# Patient Record
Sex: Female | Born: 1950 | Race: Black or African American | Hispanic: No | Marital: Single | Smoking: Never smoker
Health system: Southern US, Community
[De-identification: ages and names within clinical notes are randomized; demographics above are authoritative.]

## PROBLEM LIST (undated history)

## (undated) DIAGNOSIS — I10 Essential (primary) hypertension: Secondary | ICD-10-CM

## (undated) DIAGNOSIS — K5792 Diverticulitis of intestine, part unspecified, without perforation or abscess without bleeding: Secondary | ICD-10-CM

## (undated) DIAGNOSIS — K219 Gastro-esophageal reflux disease without esophagitis: Secondary | ICD-10-CM

## (undated) DIAGNOSIS — E049 Nontoxic goiter, unspecified: Secondary | ICD-10-CM

## (undated) DIAGNOSIS — E785 Hyperlipidemia, unspecified: Secondary | ICD-10-CM

## (undated) DIAGNOSIS — N809 Endometriosis, unspecified: Secondary | ICD-10-CM

## (undated) HISTORY — PX: LOBECTOMY: SHX5089

## (undated) HISTORY — PX: HAND SURGERY: SHX662

---

## 2004-10-28 ENCOUNTER — Ambulatory Visit: Payer: Self-pay | Admitting: Unknown Physician Specialty

## 2005-08-04 ENCOUNTER — Emergency Department: Payer: Self-pay | Admitting: Emergency Medicine

## 2006-06-29 ENCOUNTER — Other Ambulatory Visit: Payer: Self-pay

## 2006-06-29 ENCOUNTER — Observation Stay: Payer: Self-pay | Admitting: Internal Medicine

## 2009-07-17 ENCOUNTER — Emergency Department: Payer: Self-pay | Admitting: Emergency Medicine

## 2011-09-15 ENCOUNTER — Emergency Department: Payer: Self-pay | Admitting: Emergency Medicine

## 2012-02-27 ENCOUNTER — Ambulatory Visit: Payer: Self-pay | Admitting: Family Medicine

## 2015-10-17 ENCOUNTER — Emergency Department
Admission: EM | Admit: 2015-10-17 | Discharge: 2015-10-17 | Disposition: A | Discharge status: Home / Self Care | Payer: BC Managed Care – PPO | Attending: Emergency Medicine | Admitting: Emergency Medicine

## 2015-10-17 DIAGNOSIS — T7840XA Allergy, unspecified, initial encounter: Secondary | ICD-10-CM | POA: Diagnosis present

## 2015-10-17 DIAGNOSIS — X58XXXA Exposure to other specified factors, initial encounter: Secondary | ICD-10-CM | POA: Insufficient documentation

## 2015-10-17 DIAGNOSIS — Y9289 Other specified places as the place of occurrence of the external cause: Secondary | ICD-10-CM | POA: Diagnosis not present

## 2015-10-17 DIAGNOSIS — L500 Allergic urticaria: Secondary | ICD-10-CM | POA: Insufficient documentation

## 2015-10-17 DIAGNOSIS — Y998 Other external cause status: Secondary | ICD-10-CM | POA: Insufficient documentation

## 2015-10-17 DIAGNOSIS — L509 Urticaria, unspecified: Secondary | ICD-10-CM

## 2015-10-17 DIAGNOSIS — Y9389 Activity, other specified: Secondary | ICD-10-CM | POA: Diagnosis not present

## 2015-10-17 MED ORDER — FAMOTIDINE IN NACL 20-0.9 MG/50ML-% IV SOLN
20.0000 mg | Freq: Once | INTRAVENOUS | Status: AC
  Administered 2015-10-17: 20 mg via INTRAVENOUS
  Filled 2015-10-17: qty 50

## 2015-10-17 MED ORDER — PREDNISONE 20 MG PO TABS
60.0000 mg | ORAL_TABLET | Freq: Every day | ORAL | Status: AC
Start: 2015-10-17 — End: 2016-10-20

## 2015-10-17 MED ORDER — LORATADINE 10 MG PO TABS
ORAL_TABLET | ORAL | Status: AC
  Filled 2015-10-17: qty 1

## 2015-10-17 MED ORDER — CETIRIZINE HCL 5 MG/5ML PO SYRP: 5.0000 mg | ORAL_SOLUTION | Freq: Once | ORAL | Status: DC

## 2015-10-17 MED ORDER — DIPHENHYDRAMINE HCL 50 MG/ML IJ SOLN
25.0000 mg | Freq: Once | INTRAMUSCULAR | Status: AC
  Administered 2015-10-17: 25 mg via INTRAVENOUS
  Filled 2015-10-17: qty 1

## 2015-10-17 MED ORDER — METHYLPREDNISOLONE SODIUM SUCC 125 MG IJ SOLR
125.0000 mg | Freq: Once | INTRAMUSCULAR | Status: AC
  Administered 2015-10-17: 125 mg via INTRAVENOUS
  Filled 2015-10-17: qty 2

## 2015-10-17 MED ORDER — EPINEPHRINE 0.3 MG/0.3ML IJ SOAJ: 0.3000 mg | Freq: Once | INTRAMUSCULAR | Status: AC
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
## 2015-10-17 NOTE — ED Notes (Signed)
Pt given phone to call for a ride home. Pt pending discharge.  

## 2015-10-17 NOTE — ED Provider Notes (Signed)
[facility] Emergency Department Provider Note  ____________________________________________  Time seen: 1:15 AM  I have reviewed the triage vital signs and the nursing notes.   HISTORY  Chief Complaint Allergic Reaction      HPI Erica Quinn is a 64 y.o. female presents with acute onset of generalized pruritus and hives which began at approximately 11:00 tonight. Patient admits to is similar incident in the past with unknown etiology. Patient states that she took 50 mg of Benadryl before presentation to the emergency department without relief. Patient denies any difficulty breathing however does admit to unusual sensation swallowing.     Past medical history Borderline diabetes Allergic reaction (unknown etiology) There are no active problems to display for this patient.  Past surgical history None No current outpatient prescriptions on file.  Allergies No known drug allergies No family history on file.  Social History Social History  Substance Use Topics  . Smoking status: Not on file  . Smokeless tobacco: Not on file  . Alcohol Use: Not on file    Review of Systems  Constitutional: Negative for fever. Eyes: Negative for visual changes. ENT: Negative for sore throat. Cardiovascular: Negative for chest pain. Respiratory: Negative for shortness of breath. Gastrointestinal: Negative for abdominal pain, vomiting and diarrhea. Genitourinary: Negative for dysuria. Musculoskeletal: Negative for back pain. Skin: Positive for itching and rash. Neurological: Negative for headaches, focal weakness or numbness.   10-point ROS otherwise negative.  ____________________________________________   PHYSICAL EXAM:  VITAL SIGNS: ED Triage Vitals  Enc Vitals Group     BP 10/17/15 0112 142/57 mmHg     Pulse Rate 10/17/15 0112 90     Resp 10/17/15 0112 16     Temp 10/17/15 0112 97.9 F (36.6 C)     Temp Source 10/17/15 0112 Oral     SpO2  10/17/15 0112 97 %     Weight 10/17/15 0112 220 lb (99.791 kg)     Height 10/17/15 0112  (1.575 m)     Head Cir --      Peak Flow --      Pain Score 10/17/15 0115 8     Pain Loc --      Pain Edu? --      Excl. in GC? --      Constitutional: Alert and oriented. Well appearing and in no distress. Eyes: Conjunctivae are normal. PERRL. Normal extraocular movements. ENT   Head: Normocephalic and atraumatic.   Nose: No congestion/rhinnorhea.   Mouth/Throat: Mucous membranes are moist.   Neck: No stridor. Hematological/Lymphatic/Immunilogical: No cervical lymphadenopathy. Cardiovascular: Normal rate, regular rhythm. Normal and symmetric distal pulses are present in all extremities. No murmurs, rubs, or gallops. Respiratory: Normal respiratory effort without tachypnea nor retractions. Breath sounds are clear and equal bilaterally. No wheezes/rales/rhonchi. Gastrointestinal: Soft and nontender. No distention. There is no CVA tenderness. Genitourinary: deferred Musculoskeletal: Nontender with normal range of motion in all extremities. No joint effusions.  No lower extremity tenderness nor edema. Neurologic:  Normal speech and language. No gross focal neurologic deficits are appreciated. Speech is normal.  Skin:  Generalized hives with excoriation bilateral upper extremity Psychiatric: Mood and affect are normal. Speech and behavior are normal. Patient exhibits appropriate insight and judgment.     INITIAL IMPRESSION / ASSESSMENT AND PLAN / ED COURSE  Pertinent labs & imaging results that were available during my care of the patient were reviewed by me and considered in my medical decision making (see chart for details). Patient received  IV Benadryl 25 mg I Medrol 125 mg and Pepcid 20 mg with resolution of symptoms.   ____________________________________________   FINAL CLINICAL IMPRESSION(S) / ED DIAGNOSES  Final diagnoses:  Hives      Darci Currentandolph N Aseel Uhde,  MD 10/17/15 [id number]

## 2015-10-17 NOTE — Discharge Instructions (Signed)
Hives Hives are itchy, red, swollen areas of the skin. They can vary in size and location on your body. Hives can come and go for hours or several days (acute hives) or for several weeks (chronic hives). Hives do not spread from person to person (noncontagious). They may get worse with scratching, exercise, and emotional stress. CAUSES   Allergic reaction to food, additives, or drugs.  Infections, including the common cold.  Illness, such as vasculitis, lupus, or thyroid disease.  Exposure to sunlight, heat, or cold.  Exercise.  Stress.  Contact with chemicals. SYMPTOMS   Red or white swollen patches on the skin. The patches may change size, shape, and location quickly and repeatedly.  Itching.  Swelling of the hands, feet, and face. This may occur if hives develop deeper in the skin. DIAGNOSIS  Your caregiver can usually tell what is wrong by performing a physical exam. Skin or blood tests may also be done to determine the cause of your hives. In some cases, the cause cannot be determined. TREATMENT  Mild cases usually get better with medicines such as antihistamines. Severe cases may require an emergency epinephrine injection. If the cause of your hives is known, treatment includes avoiding that trigger.  HOME CARE INSTRUCTIONS   Avoid causes that trigger your hives.  Take antihistamines as directed by your caregiver to reduce the severity of your hives. Non-sedating or low-sedating antihistamines are usually recommended. Do not drive while taking an antihistamine.  Take any other medicines prescribed for itching as directed by your caregiver.  Wear loose-fitting clothing.  Keep all follow-up appointments as directed by your caregiver. SEEK MEDICAL CARE IF:   You have persistent or severe itching that is not relieved with medicine.  You have painful or swollen joints. SEEK IMMEDIATE MEDICAL CARE IF:   You have a fever.  Your tongue or lips are swollen.  You have  trouble breathing or swallowing.  You feel tightness in the throat or chest.  You have abdominal pain. These problems may be the first sign of a life-threatening allergic reaction. Call your local emergency services (911 in [place] MAKE SURE YOU:   Understand these instructions.  Will watch your condition.  Will get help right away if you are not doing well or get worse.   This information is not intended to replace advice given to you by your health care provider. Make sure you discuss any questions you have with your health care provider.   Document Released: 10/18/2005 Document Revised: 10/23/2013 Document Reviewed: 01/11/2012 Elsevier Interactive Patient Education 2016 [employer]  

## 2015-10-17 NOTE — ED Notes (Signed)
Dr. Manson PasseyBrown requested to have pt take Claritin 10mg  PO. Pt states she has that medication in her purse. Pt asked if she could take her own tablet. MD aware.

## 2015-10-17 NOTE — ED Notes (Signed)
Pt up to use the restroom

## 2015-11-16 ENCOUNTER — Encounter: Payer: Self-pay | Admitting: Gynecology

## 2015-11-16 ENCOUNTER — Ambulatory Visit
Admission: EM | Admit: 2015-11-16 | Discharge: 2015-11-16 | Disposition: A | Discharge status: Home / Self Care | Payer: BC Managed Care – PPO | Attending: Family Medicine | Admitting: Family Medicine

## 2015-11-16 DIAGNOSIS — J0100 Acute maxillary sinusitis, unspecified: Secondary | ICD-10-CM

## 2015-11-16 DIAGNOSIS — J0110 Acute frontal sinusitis, unspecified: Secondary | ICD-10-CM | POA: Diagnosis not present

## 2015-11-16 HISTORY — DX: Hyperlipidemia, unspecified: E78.5

## 2015-11-16 HISTORY — DX: Gastro-esophageal reflux disease without esophagitis: K21.9

## 2015-11-16 HISTORY — DX: Nontoxic goiter, unspecified: E04.9

## 2015-11-16 HISTORY — DX: Essential (primary) hypertension: I10

## 2015-11-16 HISTORY — DX: Diverticulitis of intestine, part unspecified, without perforation or abscess without bleeding: K57.92

## 2015-11-16 MED ORDER — AMOXICILLIN-POT CLAVULANATE 875-125 MG PO TABS: 1.0000 | ORAL_TABLET | Freq: Two times a day (BID) | ORAL | Status: DC

## 2015-11-16 MED ORDER — BENZONATATE 100 MG PO CAPS: 100.0000 mg | ORAL_CAPSULE | Freq: Three times a day (TID) | ORAL | Status: DC | PRN

## 2015-11-16 MED ORDER — GUAIFENESIN-CODEINE 100-10 MG/5ML PO SOLN: 10.0000 mL | Freq: Every evening | ORAL | Status: DC | PRN

## 2015-11-16 NOTE — ED Notes (Signed)
Patient c/o cough / sinus / facial pressure x 1 week.

## 2015-11-16 NOTE — Discharge Instructions (Signed)
Take medication as prescribed. Rest. Drink plenty of fluids.  ° °Follow up with your primary care physician this week as needed. Return to Urgent care as needed for new or worsening concerns.  ° °Sinusitis, Adult °Sinusitis is redness, soreness, and inflammation of the paranasal sinuses. Paranasal sinuses are air pockets within the bones of your face. They are located beneath your eyes, in the middle of your forehead, and above your eyes. In healthy paranasal sinuses, mucus is able to drain out, and air is able to circulate through them by way of your nose. However, when your paranasal sinuses are inflamed, mucus and air can become trapped. This can allow bacteria and other germs to grow and cause infection. °Sinusitis can develop quickly and last only a short time (acute) or continue over a long period (chronic). Sinusitis that lasts for more than 12 weeks is considered chronic. °CAUSES °Causes of sinusitis include: °· Allergies. °· Structural abnormalities, such as displacement of the cartilage that separates your nostrils (deviated septum), which can decrease the air flow through your nose and sinuses and affect sinus drainage. °· Functional abnormalities, such as when the small hairs (cilia) that line your sinuses and help remove mucus do not work properly or are not present. °SIGNS AND SYMPTOMS °Symptoms of acute and chronic sinusitis are the same. The primary symptoms are pain and pressure around the affected sinuses. Other symptoms include: °· Upper toothache. °· Earache. °· Headache. °· Bad breath. °· Decreased sense of smell and taste. °· A cough, which worsens when you are lying flat. °· Fatigue. °· Fever. °· Thick drainage from your nose, which often is green and may contain pus (purulent). °· Swelling and warmth over the affected sinuses. °DIAGNOSIS °Your health care provider will perform a physical exam. During your exam, your health care provider may perform any of the following to help determine if  you have acute sinusitis or chronic sinusitis: °· Look in your nose for signs of abnormal growths in your nostrils (nasal polyps). °· Tap over the affected sinus to check for signs of infection. °· View the inside of your sinuses using an imaging device that has a light attached (endoscope). °If your health care provider suspects that you have chronic sinusitis, one or more of the following tests may be recommended: °· Allergy tests. °· Nasal culture. A sample of mucus is taken from your nose, sent to a lab, and screened for bacteria. °· Nasal cytology. A sample of mucus is taken from your nose and examined by your health care provider to determine if your sinusitis is related to an allergy. °TREATMENT °Most cases of acute sinusitis are related to a viral infection and will resolve on their own within 10 days. Sometimes, medicines are prescribed to help relieve symptoms of both acute and chronic sinusitis. These may include pain medicines, decongestants, nasal steroid sprays, or saline sprays. °However, for sinusitis related to a bacterial infection, your health care provider will prescribe antibiotic medicines. These are medicines that will help kill the bacteria causing the infection. °Rarely, sinusitis is caused by a fungal infection. In these cases, your health care provider will prescribe antifungal medicine. °For some cases of chronic sinusitis, surgery is needed. Generally, these are cases in which sinusitis recurs more than 3 times per year, despite other treatments. °HOME CARE INSTRUCTIONS °· Drink plenty of water. Water helps thin the mucus so your sinuses can drain more easily. °· Use a humidifier. °· Inhale steam 3-4 times a day (for example, sit   in the bathroom with the shower running). °· Apply a warm, moist washcloth to your face 3-4 times a day, or as directed by your health care provider. °· Use saline nasal sprays to help moisten and clean your sinuses. °· Take medicines only as directed by your  health care provider. °· If you were prescribed either an antibiotic or antifungal medicine, finish it all even if you start to feel better. °SEEK IMMEDIATE MEDICAL CARE IF: °· You have increasing pain or severe headaches. °· You have nausea, vomiting, or drowsiness. °· You have swelling around your face. °· You have vision problems. °· You have a stiff neck. °· You have difficulty breathing. °  °This information is not intended to replace advice given to you by your health care provider. Make sure you discuss any questions you have with your health care provider. °  °Document Released: 10/18/2005 Document Revised: 11/08/2014 Document Reviewed: 11/02/2011 °Elsevier Interactive Patient Education ©2016 [employer]. ° °

## 2015-11-16 NOTE — ED Provider Notes (Signed)
[facility]  ____________________________________________  Time seen: Approximately 9:31 AM  I have reviewed the triage vital signs and the nursing notes.   HISTORY  Chief Complaint URI  HPI Erica Quinn is a 65 y.o. female  presents for 7-8 days of runny nose, nasal congestion, sinus pressure, sinus drainage. Reports occasional cough, and reports cough is primarily at night with post nasal drainage.  States the cough at night frequently keeps her awake. Denies wheezing or shortness of breath. Patient reports that sinuses feel clogged with pressure at 4 out of 10. Reports continues to eat and drink well. Denies known fevers. Denies known sick contacts  Denies chest pain, shortness of breath, weakness, dizziness, abdominal pain, dysuria, rash.  PCP: Esther Hardy   Past Medical History  Diagnosis Date  . Hypertension   . GERD (gastroesophageal reflux disease)   . Diverticulitis   . Hyperlipidemia   . Goiter     There are no active problems to display for this patient.   Past Surgical History  Procedure Laterality Date  . Hand surgery      Current Outpatient Rx  Name  Route  Sig  Dispense  Refill  . acetaminophen (TYLENOL) 325 MG tablet   Oral   Take 650 mg by mouth every 6 (six) hours as needed.         Marland Kitchen amLODipine (NORVASC) 10 MG tablet   Oral   Take 10 mg by mouth daily.         . cetirizine (ZYRTEC) 10 MG tablet   Oral   Take 10 mg by mouth daily.         .           . hydrochlorothiazide (HYDRODIURIL) 25 MG tablet   Oral   Take 25 mg by mouth daily.         Marland Kitchen omeprazole (PRILOSEC) 10 MG capsule   Oral   Take 10 mg by mouth daily.         .             Allergies Benicar; Ciprofloxacin; and Ceftin  No family history on file.  Social History Social History  Substance Use Topics  . Smoking status: Never Smoker   . Smokeless tobacco: None  . Alcohol Use: No    Review of Systems Constitutional: No fever/chills Eyes: No visual  changes. ENT: No sore throat. Positive runny nose, nasal congestion, sinus pressure and sinus drainage. Cardiovascular: Denies chest pain. Respiratory: Denies shortness of breath. Gastrointestinal: No abdominal pain.  No nausea, no vomiting.  No diarrhea.  No constipation. Genitourinary: Negative for dysuria. Musculoskeletal: Negative for back pain. Skin: Negative for rash. Neurological: Negative for headaches, focal weakness or numbness.  10-point ROS otherwise negative.  ____________________________________________   PHYSICAL EXAM:  VITAL SIGNS: ED Triage Vitals  Enc Vitals Group     BP 11/16/15 0912 141/74 mmHg     Pulse Rate 11/16/15 0912 78     Resp 11/16/15 0912 18     Temp 11/16/15 0912 98 F (36.7 C)     Temp Source 11/16/15 0912 Oral     SpO2 11/16/15 0912 97 %     Weight 11/16/15 0912 225 lb (102.059 kg)     Height 11/16/15 0912 5\' 3"  (1.6 m)     Head Cir --      Peak Flow --      Pain Score 11/16/15 0917 7     Pain Loc --  Pain Edu? --      Excl. in GC? --    Constitutional: Alert and oriented. Well appearing and in no acute distress. Eyes: Conjunctivae are normal. PERRL. EOMI. Head: Atraumatic. Mild to moderate tenderness to palpation maxillary sinuses and bilateral frontal sinuses. No erythema. No swelling.  Ears: no erythema, normal TMs bilaterally.   Nose: Nasal congestion, nasal turbinate erythema and edema. Greenish nasal drainage.  Mouth/Throat: Mucous membranes are moist.  Oropharynx non-erythematous. No tonsillar swelling or exudate.  Neck: No stridor.  No cervical spine tenderness to palpation. Hematological/Lymphatic/Immunilogical: No cervical lymphadenopathy. Cardiovascular: Normal rate, regular rhythm. Grossly normal heart sounds.  Good peripheral circulation. Respiratory: Normal respiratory effort. No retractions. Lungs CTAB. No wheezes, rales or rhonchi. Dry intermittent cough in room.  Gastrointestinal: Soft and nontender.   Musculoskeletal: No lower or upper extremity tenderness nor edema.  No calf tenderness bilaterally. Bilateral pedal pulses equal and easily palpated.  Neurologic:  Normal speech and language. No gross focal neurologic deficits are appreciated. No gait instability. Skin:  Skin is warm, dry and intact. No rash noted. Psychiatric: Mood and affect are normal. Speech and behavior are normal.  ______  ____________________________________________   LABS (all labs ordered are listed, but only abnormal results are displayed)  Labs Reviewed - No data to display   INITIAL IMPRESSION / ASSESSMENT AND PLAN / ED COURSE  Pertinent labs & imaging results that were available during my care of the patient were reviewed by me and considered in my medical decision making (see chart for details).  Well-appearing patient. No acute distress. Presents for complaints of 7- 8 days of runny nose, nasal congestion, sinus drainage and sinus pressure. Reports intermittent cough. Lungs clear throughout. Abdomen soft and nontender. Will treat sinusitis with oral Augmentin, when necessary Tessalon Perles, guaifenesin with codeine when necessary at night for cough, encourage rest, fluids and PCP follow up. Encouraged nasal rinses.  Discussed follow up with Primary care physician this week. Discussed follow up and return parameters including no resolution or any worsening concerns. Patient verbalized understanding and agreed to plan.   ____________________________________________   FINAL CLINICAL IMPRESSION(S) / ED DIAGNOSES  Final diagnoses:  Acute maxillary sinusitis, recurrence not specified  Acute frontal sinusitis, recurrence not specified       Renford DillsLindsey Shelley Pooley, NP 11/16/15 1200

## 2017-01-05 ENCOUNTER — Ambulatory Visit
Admission: EM | Admit: 2017-01-05 | Discharge: 2017-01-05 | Disposition: A | Discharge status: Home / Self Care | Payer: Self-pay | Attending: Family Medicine | Admitting: Family Medicine

## 2017-01-05 ENCOUNTER — Ambulatory Visit (INDEPENDENT_AMBULATORY_CARE_PROVIDER_SITE_OTHER): Payer: Self-pay

## 2017-01-05 DIAGNOSIS — S60012A Contusion of left thumb without damage to nail, initial encounter: Secondary | ICD-10-CM

## 2017-01-05 DIAGNOSIS — M25512 Pain in left shoulder: Secondary | ICD-10-CM

## 2017-01-05 DIAGNOSIS — S46812A Strain of other muscles, fascia and tendons at shoulder and upper arm level, left arm, initial encounter: Secondary | ICD-10-CM

## 2017-01-05 DIAGNOSIS — M542 Cervicalgia: Secondary | ICD-10-CM

## 2017-01-05 MED ORDER — CYCLOBENZAPRINE HCL 5 MG PO TABS: 5.0000 mg | ORAL_TABLET | Freq: Every evening | ORAL | 0 refills | Status: DC | PRN
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
## 2017-01-05 NOTE — Discharge Instructions (Signed)
Take medication as prescribed. Rest. Drink plenty of fluids. Stretch. Alternate heat and ice as needed.   Follow up with your primary care physician this week as needed. Return to Urgent care for new or worsening concerns.
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
## 2017-12-27 ENCOUNTER — Other Ambulatory Visit: Payer: Self-pay

## 2017-12-27 ENCOUNTER — Ambulatory Visit (INDEPENDENT_AMBULATORY_CARE_PROVIDER_SITE_OTHER): Payer: Medicare Other

## 2017-12-27 ENCOUNTER — Ambulatory Visit
Admission: EM | Admit: 2017-12-27 | Discharge: 2017-12-27 | Disposition: A | Discharge status: Home / Self Care | Payer: Medicare Other | Attending: Family Medicine | Admitting: Family Medicine

## 2017-12-27 ENCOUNTER — Encounter: Payer: Self-pay | Admitting: Emergency Medicine

## 2017-12-27 DIAGNOSIS — K5901 Slow transit constipation: Secondary | ICD-10-CM | POA: Diagnosis not present

## 2017-12-27 LAB — URINALYSIS, COMPLETE (UACMP) WITH MICROSCOPIC
BILIRUBIN URINE: NEGATIVE
Glucose, UA: NEGATIVE mg/dL
HGB URINE DIPSTICK: NEGATIVE
Ketones, ur: NEGATIVE mg/dL
Leukocytes, UA: NEGATIVE
NITRITE: NEGATIVE
PH: 7 (ref 5.0–8.0)
Protein, ur: NEGATIVE mg/dL
RBC / HPF: NONE SEEN RBC/hpf (ref 0–5)
SPECIFIC GRAVITY, URINE: 1.015 (ref 1.005–1.030)

## 2017-12-27 NOTE — ED Provider Notes (Signed)
[facility]    CSN: [id number] Arrival date & time: 12/27/17  1507     History   Chief Complaint Chief Complaint  Patient presents with  . Abdominal Pain    HPI Jeanenne Licea is a 67 y.o. female.   HPI  67 year old female presents with bilateral lower abdominal pain and increase in urinary frequency but without any dysuria or urgency.  Been bothering her for the past week.  Denies any nausea or vomiting had a normal bowel movement this morning.  Noticed no blood or mucus in her stools.  Had no fever or chills. Denies any diarrhea.  Describes the pain as intermittent. Worse When she lies recumbent.  She does have a history of diverticular disease having been given antibiotic in the past when she had the symptoms that feels similar to what she has now.        Past Medical History:  Diagnosis Date  . Diverticulitis   . GERD (gastroesophageal reflux disease)   . Goiter   . Hyperlipidemia   . Hypertension     There are no active problems to display for this patient.   Past Surgical History:  Procedure Laterality Date  . HAND SURGERY      OB History    No data available       Home Medications    Prior to Admission medications   Medication Sig Start Date End Date Taking? Authorizing Provider  amLODipine (NORVASC) 10 MG tablet Take 10 mg by mouth daily.   Yes [provider]  cetirizine (ZYRTEC) 10 MG tablet Take 10 mg by mouth daily.   Yes [provider]  cyclobenzaprine (FLEXERIL) 5 MG tablet Take 1 tablet (5 mg total) by mouth at bedtime as needed for muscle spasms. Do not drive while taking as can cause drowsiness. 01/05/17  Yes Renford Dills, NP  hydrochlorothiazide (HYDRODIURIL) 25 MG tablet Take 25 mg by mouth daily.   Yes [provider]  omeprazole (PRILOSEC) 10 MG capsule Take 10 mg by mouth daily.   Yes [provider]  acetaminophen (TYLENOL) 325 MG tablet Take 650 mg by mouth every 6 (six) hours as needed.     [provider]  EPINEPHrine 0.3 mg/0.3 mL IJ SOAJ injection Inject 0.3 mLs (0.3 mg total) into the muscle once. 10/17/15   Darci Current, MD    Family History History reviewed. No pertinent family history.  Social History Social History   Tobacco Use  . Smoking status: Never Smoker  . Smokeless tobacco: Never Used  Substance Use Topics  . Alcohol use: No  . Drug use: No     Allergies   Benicar [olmesartan]; Ciprofloxacin; and Ceftin [cefuroxime axetil]   Review of Systems Review of Systems  Constitutional: Positive for activity change. Negative for appetite change, chills, diaphoresis, fatigue and fever.  Gastrointestinal: Positive for abdominal distention and abdominal pain. Negative for diarrhea, nausea and vomiting.  All other systems reviewed and are negative.    Physical Exam Triage Vital Signs ED Triage Vitals  Enc Vitals Group     BP 12/27/17 1522 133/72     Pulse Rate 12/27/17 1522 90     Resp 12/27/17 1522 16     Temp 12/27/17 1522 98.5 F (36.9 C)     Temp Source 12/27/17 1522 Oral     SpO2 12/27/17 1522 97 %     Weight 12/27/17 1519 212 lb (96.2 kg)     Height 12/27/17 1519 5' 2.5" (  1.588 m)     Head Circumference --      Peak Flow --      Pain Score 12/27/17 1519 4     Pain Loc --      Pain Edu? --      Excl. in GC? --    No data found.  Updated Vital Signs BP 133/72 (BP Location: Left Arm)   Pulse 90   Temp 98.5 F (36.9 C) (Oral)   Resp 16   Ht 5' 2.5" (1.588 m)   Wt 212 lb (96.2 kg)   SpO2 97%   BMI 38.16 kg/m   Visual Acuity Right Eye Distance:   Left Eye Distance:   Bilateral Distance:    Right Eye Near:   Left Eye Near:    Bilateral Near:     Physical Exam  Constitutional: She is oriented to person, place, and time. She appears well-developed and well-nourished.  Non-toxic appearance. She does not appear ill. No distress.  HENT:  Head: Normocephalic.  Eyes: Pupils are equal, round, and reactive to light.    Pulmonary/Chest: Effort normal and breath sounds normal.  Abdominal: Soft. Normal appearance and bowel sounds are normal. She exhibits no distension. There is tenderness.  Examination was performed with Efraim Kaufmann, CMA, as Nurse, children's.  Bowel sounds are present in all quadrants.  Obese but no distention is noticeable.  Patient does have tenderness in the lower left and right segments suprapubic.  There is no guarding no rebound tenderness present.  Neurological: She is alert and oriented to person, place, and time.  Skin: Skin is warm and dry.  Psychiatric: She has a normal mood and affect. Her behavior is normal.  Nursing note and vitals reviewed.    UC Treatments / Results  Labs (all labs ordered are listed, but only abnormal results are displayed) Labs Reviewed  URINALYSIS, COMPLETE (UACMP) WITH MICROSCOPIC - Abnormal; Notable for the following components:      Result Value   Squamous Epithelial / LPF 6-30 (*)    Bacteria, UA RARE (*)    All other components within normal limits    EKG  EKG Interpretation None       Radiology Dg Abd 2 Views  Result Date: 12/27/2017 CLINICAL DATA:  Lower abdominal pain EXAM: ABDOMEN - 2 VIEW COMPARISON:  None. FINDINGS: Lung bases are clear. No free air beneath the diaphragm. Nonobstructed bowel gas pattern with large amount of stool in the colon. Calcified phleboliths in the pelvis. IMPRESSION: Nonobstructed gas pattern with large amount of stool in the colon Electronically Signed   By: Jasmine Pang M.D.   On: 12/27/2017 16:34    Procedures Procedures (including critical care time)  Medications Ordered in UC Medications - No data to display   Initial Impression / Assessment and Plan / UC Course  I have reviewed the triage vital signs and the nursing notes.  Pertinent labs & imaging results that were available during my care of the patient were reviewed by me and considered in my medical decision making (see chart for  details).     Plan: 1. Test/x-ray results and diagnosis reviewed with patient 2. rx as per orders; risks, benefits, potential side effects reviewed with patient 3. Recommend supportive treatment with creased fiber in diet.  Meantime recommend mag citrate to eliminate the stool.  She worsens or is not improving she should see her primary care physician or follow-up in our clinic. 4. F/u prn if symptoms worsen or don't improve  Final Clinical Impressions(s) / UC Diagnoses   Final diagnoses:  Slow transit constipation    ED Discharge Orders    None       Controlled Substance Prescriptions [place] Controlled Substance Registry consulted? Not Applicable   Lutricia FeilRoemer, Shaunta Oncale P, PA-C 12/27/17 1709

## 2017-12-27 NOTE — Discharge Instructions (Signed)
Use mag citrate for a laxative.  Recommend starting taking more fiber in your diet ;may use Metamucil.

## 2017-12-27 NOTE — ED Triage Notes (Signed)
Patient c/o lower abdominal pain and increase in urinary frequency for the past week.

## 2018-09-02 ENCOUNTER — Encounter: Payer: Self-pay | Admitting: Gynecology

## 2018-09-02 ENCOUNTER — Other Ambulatory Visit: Payer: Self-pay

## 2018-09-02 ENCOUNTER — Ambulatory Visit
Admission: EM | Admit: 2018-09-02 | Discharge: 2018-09-02 | Disposition: A | Discharge status: Home / Self Care | Payer: BC Managed Care – PPO | Attending: Family Medicine | Admitting: Family Medicine

## 2018-09-02 DIAGNOSIS — K5732 Diverticulitis of large intestine without perforation or abscess without bleeding: Secondary | ICD-10-CM

## 2018-09-02 MED ORDER — AMOXICILLIN-POT CLAVULANATE 875-125 MG PO TABS: 1.0000 | ORAL_TABLET | Freq: Two times a day (BID) | ORAL | 0 refills | Status: DC

## 2018-09-02 NOTE — ED Triage Notes (Signed)
Patient c/o abdominal pain x 3-4 days. Per patient pain rotate to her back.

## 2018-09-02 NOTE — ED Provider Notes (Signed)
[facility]    CSN: [id number] Arrival date & time: 09/02/18  0902  History   Chief Complaint Chief Complaint  Patient presents with  . Abdominal Pain    HPI  67 year old female presents with abdominal pain.  Patient reports lower abdominal pain, particular the left lower quadrant.  States that it has been going on for 3 to 4 days.  Moderate in severity.  No fever.  No chills.  No urinary symptoms.  No reports of constipation.  Endorses regular bowel movements.  No hematochezia or melena.  No known exacerbating or relieving factors.  No other reported symptoms.  No other complaints.  PMH, Surgical Hx, Family Hx, Social History reviewed and updated as below.  Past Medical History:  Diagnosis Date  . Diverticulitis   . GERD (gastroesophageal reflux disease)   . Goiter   . Hyperlipidemia   . Hypertension    Past Surgical History:  Procedure Laterality Date  . HAND SURGERY      OB History   None      Home Medications    Prior to Admission medications   Medication Sig Start Date End Date Taking? Authorizing Provider  acetaminophen (TYLENOL) 325 MG tablet Take 650 mg by mouth every 6 (six) hours as needed.   Yes [provider]  amLODipine (NORVASC) 10 MG tablet Take 10 mg by mouth daily.   Yes [provider]  aspirin EC 81 MG tablet Take by mouth.   Yes [provider]  cetirizine (ZYRTEC) 10 MG tablet Take 10 mg by mouth daily.   Yes [provider]  cyclobenzaprine (FLEXERIL) 5 MG tablet Take 1 tablet (5 mg total) by mouth at bedtime as needed for muscle spasms. Do not drive while taking as can cause drowsiness. 01/05/17  Yes Renford Dills, NP  EPINEPHrine 0.3 mg/0.3 mL IJ SOAJ injection Inject 0.3 mLs (0.3 mg total) into the muscle once. 10/17/15  Yes Darci Current, MD  hydrochlorothiazide (HYDRODIURIL) 25 MG tablet Take 25 mg by mouth daily.   Yes [provider]  omeprazole (PRILOSEC) 10 MG capsule Take  10 mg by mouth daily.   Yes [provider]  amoxicillin-clavulanate (AUGMENTIN) 875-125 MG tablet Take 1 tablet by mouth every 12 (twelve) hours. 09/02/18   Tommie Sams, DO    Family History Family History  Problem Relation Age of Onset  . Diabetes Father     Social History Social History   Tobacco Use  . Smoking status: Never Smoker  . Smokeless tobacco: Never Used  Substance Use Topics  . Alcohol use: No  . Drug use: No     Allergies   Benicar [olmesartan]; Ciprofloxacin; and Ceftin [cefuroxime axetil]   Review of Systems Review of Systems  Constitutional: Negative for fever.  Gastrointestinal: Positive for abdominal pain.   Physical Exam Triage Vital Signs ED Triage Vitals  Enc Vitals Group     BP 09/02/18 0909 (!) 144/74     Pulse Rate 09/02/18 0909 71     Resp 09/02/18 0909 16     Temp 09/02/18 0909 98.8 F (37.1 C)     Temp Source 09/02/18 0909 Oral     SpO2 09/02/18 0909 98 %     Weight 09/02/18 0910 214 lb (97.1 kg)     Height 09/02/18 0910 5\' 2"  (1.575 m)     Head Circumference --      Peak Flow --      Pain Score 09/02/18 0909 5  Pain Loc --      Pain Edu? --      Excl. in GC? --    Updated Vital Signs BP (!) 144/74 (BP Location: Left Arm)   Pulse 71   Temp 98.8 F (37.1 C) (Oral)   Resp 16   Ht 5\' 2"  (1.575 m)   Wt 97.1 kg   SpO2 98%   BMI 39.14 kg/m   Visual Acuity Right Eye Distance:   Left Eye Distance:   Bilateral Distance:    Right Eye Near:   Left Eye Near:    Bilateral Near:     Physical Exam  Constitutional: She is oriented to person, place, and time. She appears well-developed. No distress.  Cardiovascular: Normal rate and regular rhythm.  Pulmonary/Chest: Effort normal and breath sounds normal. She has no wheezes. She has no rales.  Abdominal: Soft. Bowel sounds are normal. She exhibits no distension. There is no rebound and no guarding.  Tender in the left lower quadrant.  Neurological: She is alert and  oriented to person, place, and time.  Psychiatric: She has a normal mood and affect. Her behavior is normal.  Nursing note and vitals reviewed.  UC Treatments / Results  Labs (all labs ordered are listed, but only abnormal results are displayed) Labs Reviewed  URINALYSIS, COMPLETE (UACMP) WITH MICROSCOPIC    EKG None  Radiology No results found.  Procedures Procedures (including critical care time)  Medications Ordered in UC Medications - No data to display  Initial Impression / Assessment and Plan / UC Course  I have reviewed the triage vital signs and the nursing notes.  Pertinent labs & imaging results that were available during my care of the patient were reviewed by me and considered in my medical decision making (see chart for details).    67 year old female presents with left lower quadrant pain and history of diverticulitis.  Suspect diverticulitis.  Treating with Augmentin.  Final Clinical Impressions(s) / UC Diagnoses   Final diagnoses:  Diverticulitis of colon   Discharge Instructions   None    ED Prescriptions    Medication Sig Dispense Auth. Provider   amoxicillin-clavulanate (AUGMENTIN) 875-125 MG tablet Take 1 tablet by mouth every 12 (twelve) hours. 14 tablet Tommie Sams, DO     Controlled Substance Prescriptions [place] Controlled Substance Registry consulted? Not Applicable   Tommie Sams, [place] 09/02/18 1610

## 2018-09-14 IMAGING — CR DG ABDOMEN 2V
3 series · 3 of 3 positions shown · non-contrast
Comparison: None.

CLINICAL DATA: Lower abdominal pain

EXAM:
ABDOMEN - 2 VIEW

[abdomen erect]
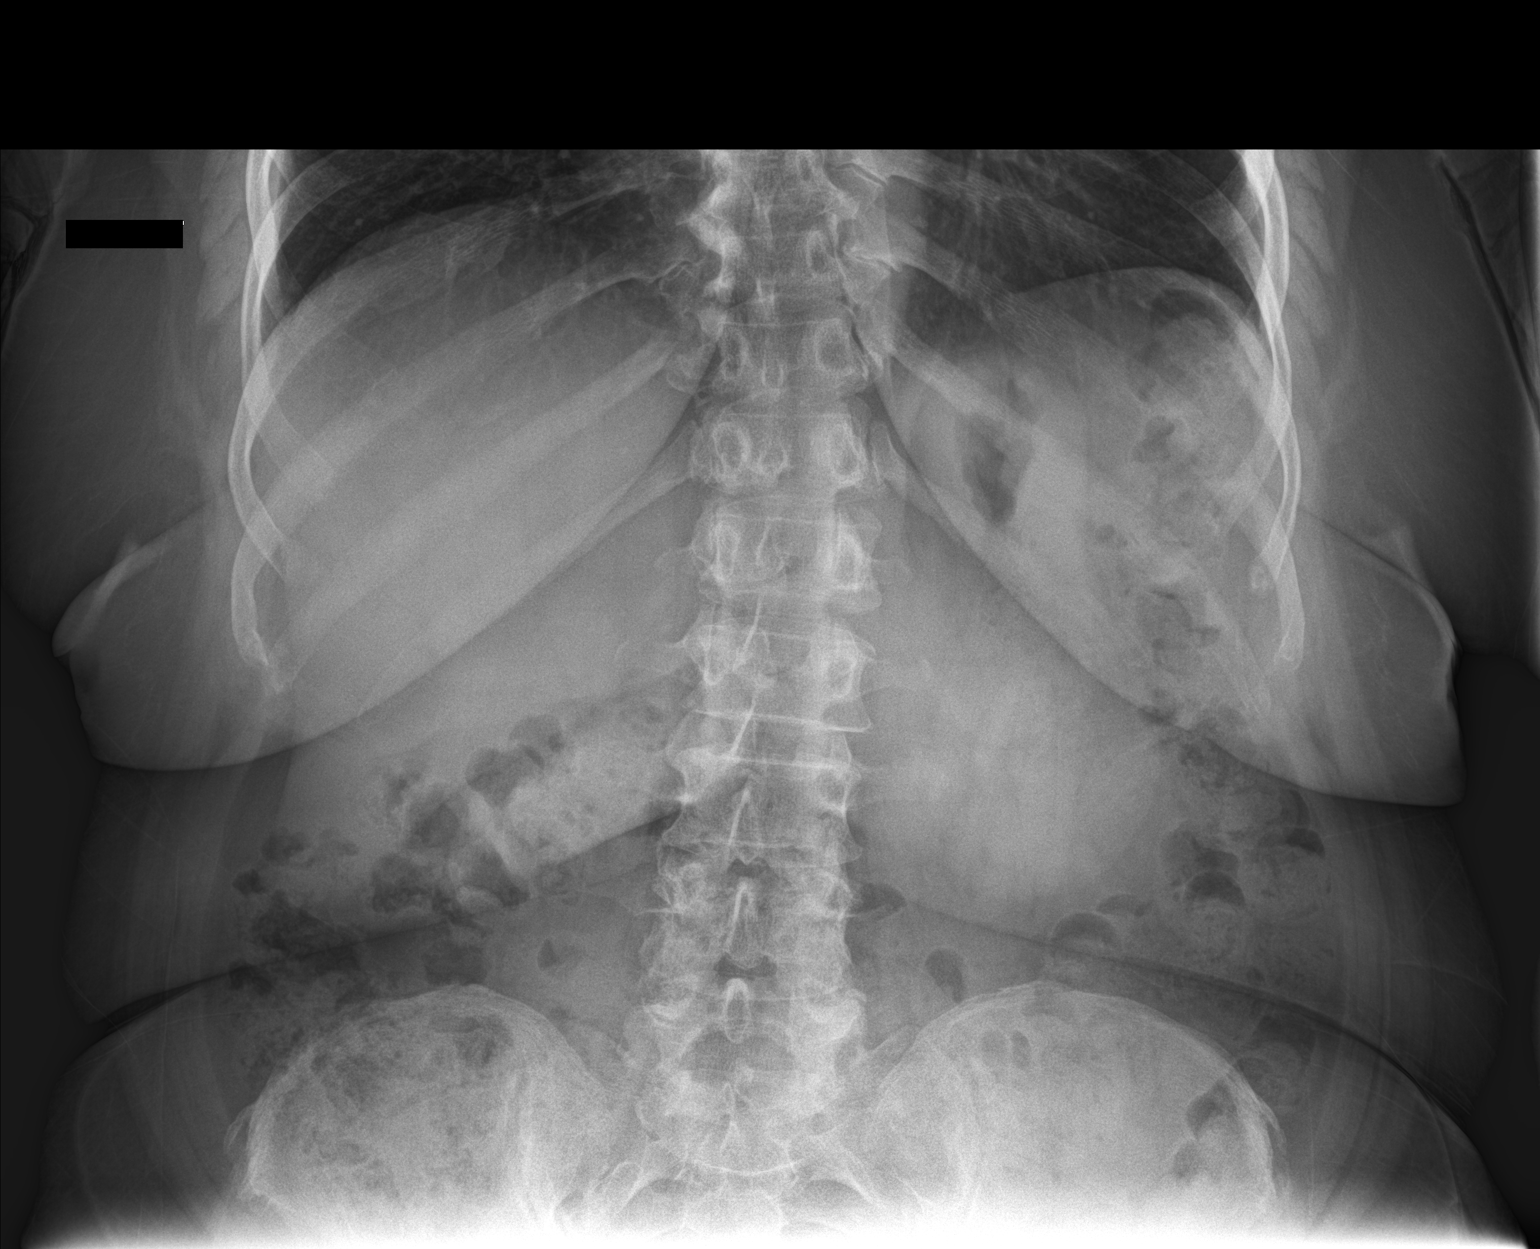

[abdomen supine (1 of 2)]
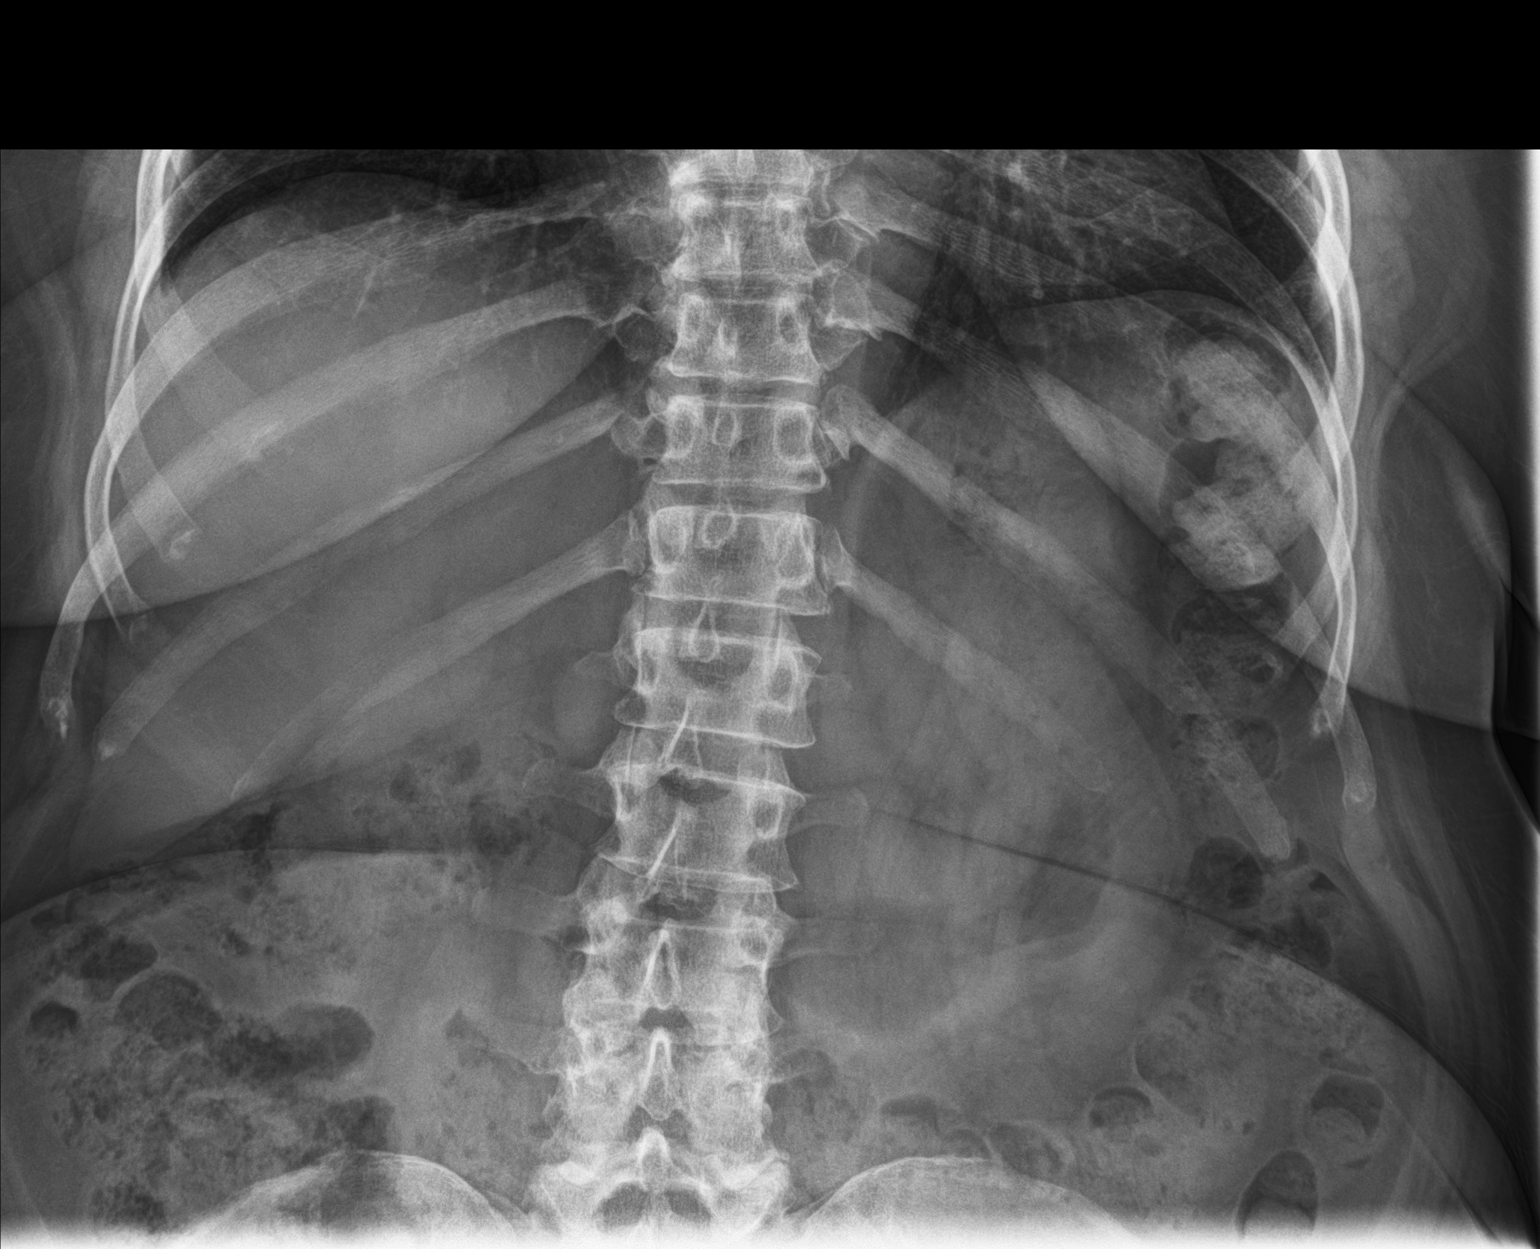

[abdomen supine (2 of 2)]
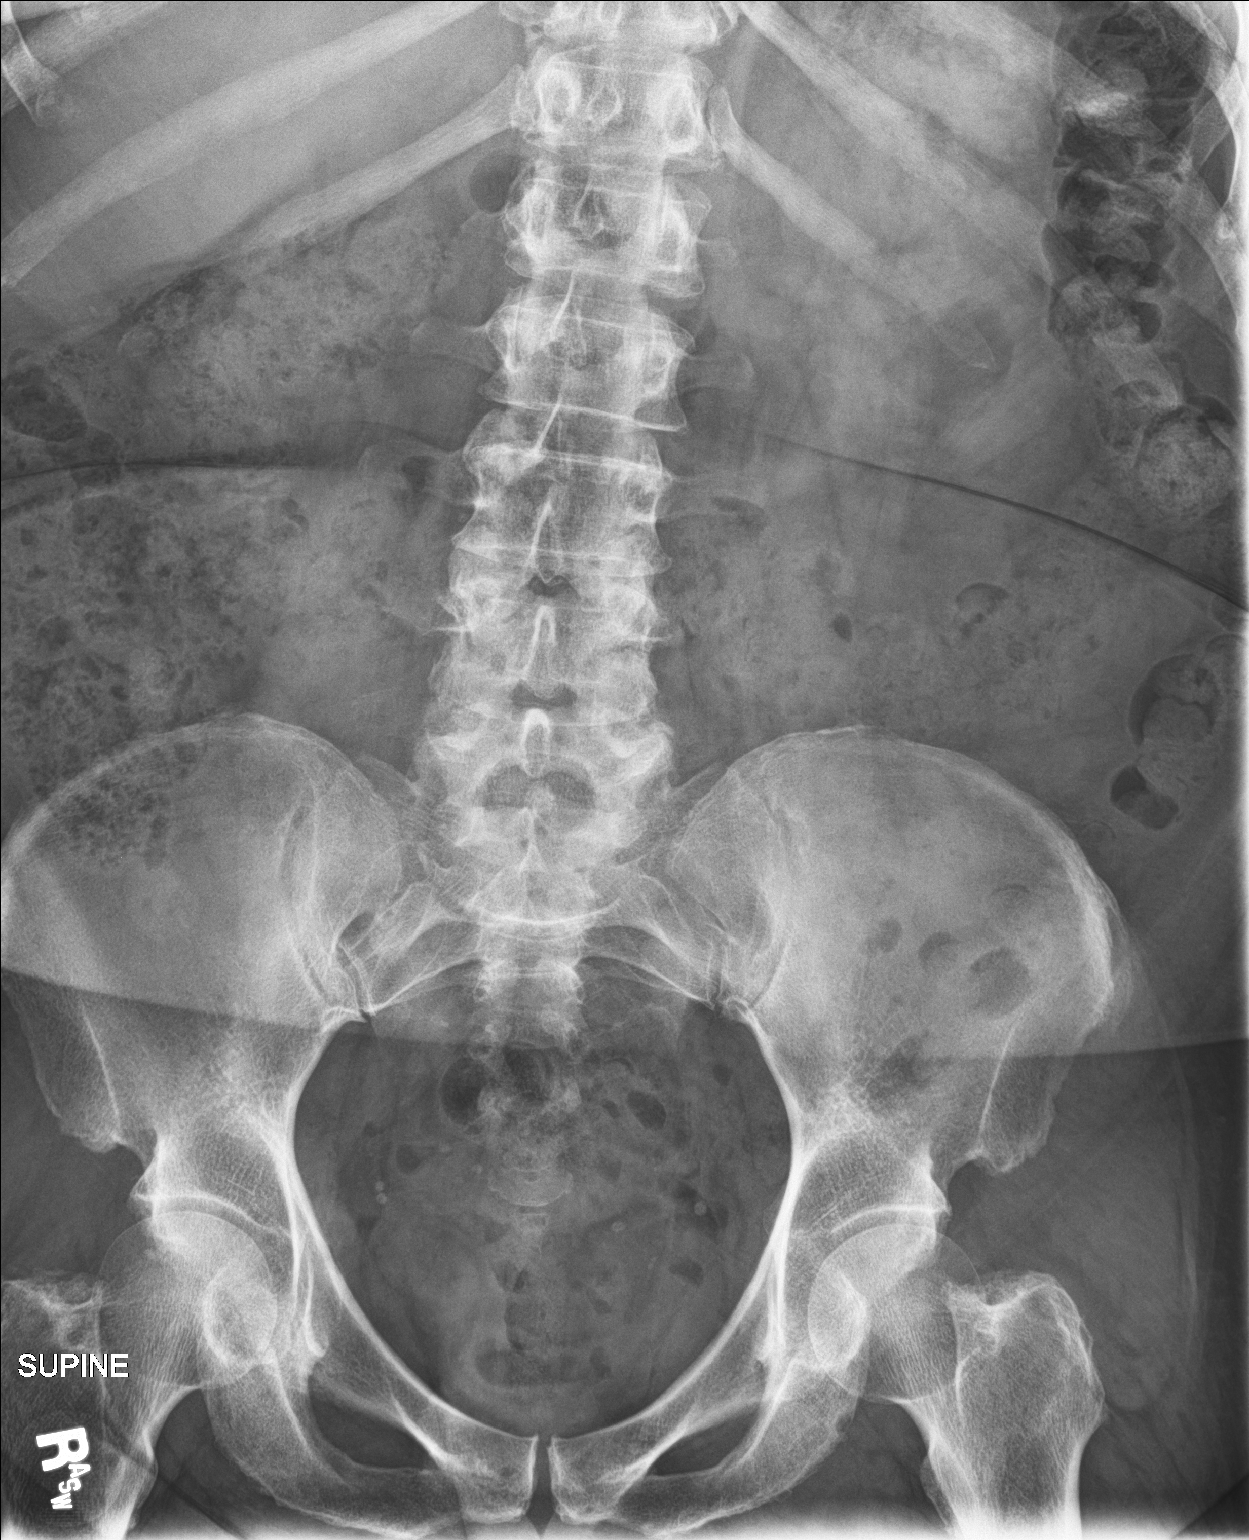

[3 of 3 positions shown; findings below may reference images not displayed]

FINDINGS: Lung bases are clear. No free air beneath the diaphragm.
Nonobstructed bowel gas pattern with large amount of stool in the
colon. Calcified phleboliths in the pelvis.
IMPRESSION: Nonobstructed gas pattern with large amount of stool in the colon

## 2019-05-24 ENCOUNTER — Ambulatory Visit: Payer: Self-pay | Admitting: Nurse Practitioner

## 2019-09-14 ENCOUNTER — Ambulatory Visit (LOCAL_COMMUNITY_HEALTH_CENTER): Payer: BC Managed Care – PPO

## 2019-09-14 ENCOUNTER — Other Ambulatory Visit: Payer: Self-pay

## 2019-09-14 DIAGNOSIS — Z111 Encounter for screening for respiratory tuberculosis: Secondary | ICD-10-CM

## 2019-09-17 ENCOUNTER — Ambulatory Visit (LOCAL_COMMUNITY_HEALTH_CENTER): Payer: BC Managed Care – PPO

## 2019-09-17 DIAGNOSIS — Z111 Encounter for screening for respiratory tuberculosis: Secondary | ICD-10-CM

## 2019-09-17 LAB — TB SKIN TEST
Induration: 0 mm
TB Skin Test: NEGATIVE

## 2019-09-17 NOTE — Progress Notes (Signed)
PPD Reading.  0 mm  Negative.

## 2020-03-31 ENCOUNTER — Other Ambulatory Visit: Payer: Self-pay

## 2020-03-31 ENCOUNTER — Ambulatory Visit
Admission: EM | Admit: 2020-03-31 | Discharge: 2020-03-31 | Disposition: A | Discharge status: Home / Self Care | Payer: Medicare Other | Attending: Family Medicine | Admitting: Family Medicine

## 2020-03-31 DIAGNOSIS — R1032 Left lower quadrant pain: Secondary | ICD-10-CM | POA: Insufficient documentation

## 2020-03-31 LAB — URINALYSIS, COMPLETE (UACMP) WITH MICROSCOPIC
Bacteria, UA: NONE SEEN
Bilirubin Urine: NEGATIVE
Glucose, UA: NEGATIVE mg/dL
Ketones, ur: NEGATIVE mg/dL
Leukocytes,Ua: NEGATIVE
Nitrite: NEGATIVE
Protein, ur: NEGATIVE mg/dL
Specific Gravity, Urine: 1.02 (ref 1.005–1.030)
WBC, UA: NONE SEEN WBC/hpf (ref 0–5)
pH: 8 (ref 5.0–8.0)

## 2020-03-31 MED ORDER — AMOXICILLIN-POT CLAVULANATE 875-125 MG PO TABS: 1.0000 | ORAL_TABLET | Freq: Two times a day (BID) | ORAL | 0 refills | Status: DC

## 2020-03-31 NOTE — ED Provider Notes (Signed)
[facility]    CSN: [id number] Arrival date & time: 03/31/20  1559      History   Chief Complaint Chief Complaint  Patient presents with  . Abdominal Pain    HPI Erica Quinn is a 69 y.o. female.   69 yo female with a c/o left lower abdominal pain for the last 4 days, associated with decreased appetite. Denies any vomiting, fevers, chills, diarrhea, constipation, melena, hematochezia. States she has a h/o diverticulitis and symptoms are similar to prior episodes.   Patient also c/o increased, frequent urination for the past 3 days. Denies any dysuria, hematuria.    Abdominal Pain   Past Medical History:  Diagnosis Date  . Diverticulitis   . GERD (gastroesophageal reflux disease)   . Goiter   . Hyperlipidemia   . Hypertension     There are no problems to display for this patient.   Past Surgical History:  Procedure Laterality Date  . HAND SURGERY      OB History   No obstetric history on file.      Home Medications    Prior to Admission medications   Medication Sig Start Date End Date Taking? Authorizing Provider  acetaminophen (TYLENOL) 325 MG tablet Take 650 mg by mouth every 6 (six) hours as needed.    [provider]  amLODipine (NORVASC) 10 MG tablet Take 10 mg by mouth daily.    [provider]  amoxicillin-clavulanate (AUGMENTIN) 875-125 MG tablet Take 1 tablet by mouth 2 (two) times daily. 03/31/20   Payton Mccallum, MD  cetirizine (ZYRTEC) 10 MG tablet Take 10 mg by mouth daily.    [provider]  cyclobenzaprine (FLEXERIL) 5 MG tablet Take 1 tablet (5 mg total) by mouth at bedtime as needed for muscle spasms. Do not drive while taking as can cause drowsiness. 01/05/17   Renford Dills, NP  EPINEPHrine 0.3 mg/0.3 mL IJ SOAJ injection Inject 0.3 mLs (0.3 mg total) into the muscle once. 10/17/15   Darci Current, MD  hydrochlorothiazide (HYDRODIURIL) 25 MG tablet Take 25 mg by mouth daily.    [provider]  omeprazole (PRILOSEC) 10 MG capsule Take 10 mg by mouth daily.    [provider]    Family History Family History  Problem Relation Age of Onset  . Diabetes Father     Social History Social History   Tobacco Use  . Smoking status: Never Smoker  . Smokeless tobacco: Never Used  Substance Use Topics  . Alcohol use: No  . Drug use: No     Allergies   Benicar [olmesartan], Ciprofloxacin, and Ceftin [cefuroxime axetil]   Review of Systems Review of Systems  Gastrointestinal: Positive for abdominal pain.     Physical Exam Triage Vital Signs ED Triage Vitals  Enc Vitals Group     BP 03/31/20 1614 (!) 145/76     Pulse Rate 03/31/20 1610 72     Resp 03/31/20 1610 16     Temp 03/31/20 1610 99.1 F (37.3 C)     Temp src --      SpO2 03/31/20 1610 100 %     Weight --      Height --      Head Circumference --      Peak Flow --      Pain Score 03/31/20 1611 7     Pain Loc --      Pain Edu? --      Excl. in GC? --  No data found.  Updated Vital Signs BP (!) 145/76   Pulse 72   Temp 99.1 F (37.3 C)   Resp 16   SpO2 100%   Visual Acuity Right Eye Distance:   Left Eye Distance:   Bilateral Distance:    Right Eye Near:   Left Eye Near:    Bilateral Near:     Physical Exam Vitals and nursing note reviewed.  Constitutional:      General: She is not in acute distress.    Appearance: She is not toxic-appearing or diaphoretic.  Cardiovascular:     Rate and Rhythm: Normal rate.  Pulmonary:     Effort: Pulmonary effort is normal. No respiratory distress.  Abdominal:     General: Bowel sounds are normal. There is no distension.     Palpations: Abdomen is soft. There is no mass.     Tenderness: There is abdominal tenderness (left lower quadrant, mild; no rebound or guarding). There is no right CVA tenderness, left CVA tenderness, guarding or rebound.     Hernia: No hernia is present.  Neurological:     Mental Status: She is alert.       UC Treatments / Results  Labs (all labs ordered are listed, but only abnormal results are displayed) Labs Reviewed  URINALYSIS, COMPLETE (UACMP) WITH MICROSCOPIC - Abnormal; Notable for the following components:      Result Value   Hgb urine dipstick TRACE (*)    All other components within normal limits    EKG   Radiology No results found.  Procedures Procedures (including critical care time)  Medications Ordered in UC Medications - No data to display  Initial Impression / Assessment and Plan / UC Course  I have reviewed the triage vital signs and the nursing notes.  Pertinent labs & imaging results that were available during my care of the patient were reviewed by me and considered in my medical decision making (see chart for details).     Final Clinical Impressions(s) / UC Diagnoses   Final diagnoses:  Abdominal pain, left lower quadrant    ED Prescriptions    Medication Sig Dispense Auth. Provider   amoxicillin-clavulanate (AUGMENTIN) 875-125 MG tablet Take 1 tablet by mouth 2 (two) times daily. 20 tablet Norval Gable, MD     1. Lab results and diagnosis reviewed with patient 2. rx as per orders above; reviewed possible side effects, interactions, risks and benefits  3. Recommend supportive treatment with clear liquids, then advance diet slowly as tolerated 4. Go to Emergency Department for further evaluation/management if symptoms worsen  5. Follow-up prn   PDMP not reviewed this encounter.   Norval Gable, MD 03/31/20 [phone]

## 2020-03-31 NOTE — ED Triage Notes (Signed)
Pt c/o LLQ abdominal pain x 4 days, hx of diverticulitis. Pt also concerned she may have a UTI - having increased urination x 3 days.

## 2020-11-23 ENCOUNTER — Other Ambulatory Visit: Payer: Self-pay

## 2020-11-23 ENCOUNTER — Ambulatory Visit
Admission: EM | Admit: 2020-11-23 | Discharge: 2020-11-23 | Disposition: A | Discharge status: Home / Self Care | Payer: Medicare Other | Attending: Physician Assistant | Admitting: Physician Assistant

## 2020-11-23 ENCOUNTER — Encounter: Payer: Self-pay | Admitting: Emergency Medicine

## 2020-11-23 DIAGNOSIS — B9689 Other specified bacterial agents as the cause of diseases classified elsewhere: Secondary | ICD-10-CM

## 2020-11-23 DIAGNOSIS — B373 Candidiasis of vulva and vagina: Secondary | ICD-10-CM | POA: Insufficient documentation

## 2020-11-23 DIAGNOSIS — N760 Acute vaginitis: Secondary | ICD-10-CM | POA: Diagnosis present

## 2020-11-23 DIAGNOSIS — B3731 Acute candidiasis of vulva and vagina: Secondary | ICD-10-CM

## 2020-11-23 LAB — URINALYSIS, COMPLETE (UACMP) WITH MICROSCOPIC
Bacteria, UA: NONE SEEN
Bilirubin Urine: NEGATIVE
Glucose, UA: NEGATIVE mg/dL
Hgb urine dipstick: NEGATIVE
Ketones, ur: NEGATIVE mg/dL
Leukocytes,Ua: NEGATIVE
Nitrite: NEGATIVE
Protein, ur: NEGATIVE mg/dL
RBC / HPF: NONE SEEN RBC/hpf (ref 0–5)
Specific Gravity, Urine: 1.015 (ref 1.005–1.030)
WBC, UA: NONE SEEN WBC/hpf (ref 0–5)
pH: 7 (ref 5.0–8.0)

## 2020-11-23 LAB — WET PREP, GENITAL
Sperm: NONE SEEN
Trich, Wet Prep: NONE SEEN
WBC, Wet Prep HPF POC: NONE SEEN

## 2020-11-23 MED ORDER — METRONIDAZOLE 500 MG PO TABS: 500.0000 mg | ORAL_TABLET | Freq: Two times a day (BID) | ORAL | 0 refills | Status: DC

## 2020-11-23 MED ORDER — FLUCONAZOLE 150 MG PO TABS: 150.0000 mg | ORAL_TABLET | ORAL | 0 refills | Status: DC

## 2020-11-23 NOTE — ED Provider Notes (Signed)
[facility] [place]   Name: Erica Quinn DOB: 07/06/1951 MRN: [id number] CSN: [id number] PCP: Pcp, No  Arrival date and time:  11/23/20 1527  Chief Complaint:  Urinary Frequency and Abdominal Pain   NOTE: Prior to seeing the patient today, I have reviewed the triage nursing documentation and vital signs. Clinical staff has updated patient's PMH/PSHx, current medication list, and drug allergies/intolerances to ensure comprehensive history available to assist in medical decision making.   History:   HPI: Erica Quinn is a 70 y.o. female who presents today with complaints of pain with urination, abdominal pain and back pain x48 hours.  She denies fevers, nausea body ache, or generalized feeling of fatigue.  Patient states she has had these symptoms before, but it was "a while ago".  She has no concerns of sexually transmitted infections.  No recent antibiotic use.   Past Medical History:  Diagnosis Date  . Diverticulitis   . GERD (gastroesophageal reflux disease)   . Goiter   . Hyperlipidemia   . Hypertension     Past Surgical History:  Procedure Laterality Date  . HAND SURGERY      Family History  Problem Relation Age of Onset  . Diabetes Father     Social History   Tobacco Use  . Smoking status: Never Smoker  . Smokeless tobacco: Never Used  Vaping Use  . Vaping Use: Never used  Substance Use Topics  . Alcohol use: No  . Drug use: No    There are no problems to display for this patient.   Home Medications:    Current Meds  Medication Sig  . acetaminophen (TYLENOL) 325 MG tablet Take 650 mg by mouth every 6 (six) hours as needed.  Marland Kitchen amLODipine (NORVASC) 10 MG tablet Take 10 mg by mouth daily.  . cetirizine (ZYRTEC) 10 MG tablet Take 10 mg by mouth daily.  . fluconazole (DIFLUCAN) 150 MG tablet Take 1 tablet (150 mg total) by mouth every 3 (three) days.  . hydrochlorothiazide (HYDRODIURIL) 25 MG tablet Take 25 mg by mouth daily.  .  metroNIDAZOLE (FLAGYL) 500 MG tablet Take 1 tablet (500 mg total) by mouth 2 (two) times daily.  Marland Kitchen omeprazole (PRILOSEC) 10 MG capsule Take 10 mg by mouth daily.    Allergies:   Benicar [olmesartan], Ciprofloxacin, and Ceftin [cefuroxime axetil]  Review of Systems (ROS): Review of Systems  Constitutional: Negative for chills, fatigue and fever.  Gastrointestinal: Positive for abdominal pain. Negative for diarrhea, nausea and vomiting.  Genitourinary: Positive for dysuria, frequency and pelvic pain. Negative for difficulty urinating and flank pain.  Skin: Negative for color change.  All other systems reviewed and are negative.    Vital Signs: Today's Vitals   11/23/20 1559 11/23/20 1601 11/23/20 1623  BP:  (!) 157/76   Pulse:  77   Resp:  18   Temp:  98.8 F (37.1 C)   TempSrc:  Oral   SpO2:  98%   Weight: 214 lb 1.1 oz (97.1 kg)    Height: 5\' 2"  (1.575 m)    PainSc: 4   4     Physical Exam: Physical Exam Vitals and nursing note reviewed.  Constitutional:      Appearance: Normal appearance.  Cardiovascular:     Rate and Rhythm: Normal rate and regular rhythm.     Pulses: Normal pulses.     Heart sounds: Normal heart sounds.  Pulmonary:     Effort: Pulmonary effort is normal.  Breath sounds: Normal breath sounds.  Abdominal:     General: Bowel sounds are normal.     Tenderness: There is abdominal tenderness in the suprapubic area.  Skin:    General: Skin is warm and dry.  Neurological:     General: No focal deficit present.     Mental Status: She is alert and oriented to person, place, and time.  Psychiatric:        Mood and Affect: Mood normal.        Behavior: Behavior normal.      Urgent Care Treatments / Results:   LABS: PLEASE NOTE: all labs that were ordered this encounter are listed, however only abnormal results are displayed. Labs Reviewed  WET PREP, GENITAL - Abnormal; Notable for the following components:      Result Value   Yeast Wet Prep  HPF POC PRESENT (*)    Clue Cells Wet Prep HPF POC PRESENT (*)    All other components within normal limits  URINALYSIS, COMPLETE (UACMP) WITH MICROSCOPIC    EKG: -None  RADIOLOGY: No results found.  PROCEDURES: Procedures  MEDICATIONS RECEIVED THIS VISIT: Medications - No data to display  PERTINENT CLINICAL COURSE NOTES/UPDATES:   Initial Impression / Assessment and Plan / Urgent Care Course:  Pertinent labs & imaging results that were available during my care of the patient were personally reviewed by me and considered in my medical decision making (see lab/imaging section of note for values and interpretations).  Erica Quinn is a 70 y.o. female who presents to [facility] today with complaints of dysuria, diagnosed with Candida vaginitis and bacterial vaginosis, and treated as such with the medications below. NP and patient reviewed discharge instructions below during visit.   Patient is well appearing overall in clinic today. She does not appear to be in any acute distress. Presenting symptoms (see HPI) and exam as documented above.   I have reviewed the follow up and strict return precautions for any new or worsening symptoms. Patient is aware of symptoms that would be deemed urgent/emergent, and would thus require further evaluation either here or in the emergency department. At the time of discharge, she verbalized understanding and consent with the discharge plan as it was reviewed with her. All questions were fielded by provider and/or clinic staff prior to patient discharge.    Final Clinical Impressions / Urgent Care Diagnoses:   Final diagnoses:  Candida vaginitis  BV (bacterial vaginosis)    New Prescriptions:  [place] Controlled Substance Registry consulted? Not Applicable  Meds ordered this encounter  Medications  . metroNIDAZOLE (FLAGYL) 500 MG tablet    Sig: Take 1 tablet (500 mg total) by mouth 2 (two) times daily.    Dispense:  14 tablet    Refill:  0   . fluconazole (DIFLUCAN) 150 MG tablet    Sig: Take 1 tablet (150 mg total) by mouth every 3 (three) days.    Dispense:  2 tablet    Refill:  0      Discharge Instructions     You were seen for urinary discomfort and are being treated for yeast infection and bacterial vaginosis.   Take the antibiotics as prescribed until they're finished. If you think you're having a reaction, stop the medication, take benadryl and go to the nearest urgent care/emergency room. Take a probiotic while taking the antibiotic to decrease the chances of stomach upset.   Increase your water intake.  Make sure you wear breathable cotton underwear and  change frequently.  Take care, Dr. Sharlet Salina, NP-c     Recommended Follow up Care:  Patient encouraged to follow up with the following provider within the specified time frame, or sooner as dictated by the severity of her symptoms. As always, she was instructed that for any urgent/emergent care needs, she should seek care either here or in the emergency department for more immediate evaluation.   Bailey Mech, DNP, NP-c    Bailey Mech, NP 11/23/20 [phone]

## 2020-11-23 NOTE — ED Triage Notes (Signed)
Pt c/o urinary frequency, sweats, left lower abdominal pain and lower back pain. Started about 2 days ago. Denies fevers. She also states it feels like she has to have a BM but then she can not. Last BM this morning.

## 2020-11-23 NOTE — Discharge Instructions (Signed)
You were seen for urinary discomfort and are being treated for yeast infection and bacterial vaginosis.   Take the antibiotics as prescribed until they're finished. If you think you're having a reaction, stop the medication, take benadryl and go to the nearest urgent care/emergency room. Take a probiotic while taking the antibiotic to decrease the chances of stomach upset.   Increase your water intake.  Make sure you wear breathable cotton underwear and change frequently.  Take care, Dr. Sharlet Salina, NP-c

## 2021-01-25 ENCOUNTER — Other Ambulatory Visit: Payer: Self-pay

## 2021-01-25 ENCOUNTER — Ambulatory Visit
Admission: EM | Admit: 2021-01-25 | Discharge: 2021-01-25 | Disposition: A | Discharge status: Home / Self Care | Payer: BC Managed Care – PPO | Attending: Family Medicine | Admitting: Family Medicine

## 2021-01-25 DIAGNOSIS — J069 Acute upper respiratory infection, unspecified: Secondary | ICD-10-CM

## 2021-01-25 MED ORDER — IPRATROPIUM BROMIDE 0.06 % NA SOLN: 2.0000 | Freq: Four times a day (QID) | NASAL | 0 refills | Status: AC | PRN

## 2021-01-25 MED ORDER — PROMETHAZINE-DM 6.25-15 MG/5ML PO SYRP: 5.0000 mL | ORAL_SOLUTION | Freq: Four times a day (QID) | ORAL | 0 refills | Status: AC | PRN

## 2021-01-25 NOTE — Discharge Instructions (Signed)
Rest.  Medications as directed for cough and congestion.  Take care  Dr. Adriana Simas

## 2021-01-25 NOTE — ED Triage Notes (Signed)
Pt c/o productive cough, sinus congestion for about 3 days. Pt has taken Mucinex and Robitussin with no improvement. Pt denies f/n/v/d or other symptoms.

## 2021-01-25 NOTE — ED Provider Notes (Signed)
[facility]    CSN: [id number] Arrival date & time: 01/25/21  1438      History   Chief Complaint Chief Complaint  Patient presents with  . Cough  . Nasal Congestion   HPI   70 year old female presents with the above complaints.  Patient reports that she has had symptoms for the past 3 days.  She reports cough, congestion.  She reports recent hoarseness.  Cough is productive.  No documented fever.  She reports use of over-the-counter Mucinex and Robitussin without improvement.  No reported sick contacts.  No known exacerbating factors.  No other complaints.  Past Medical History:  Diagnosis Date  . Diverticulitis   . GERD (gastroesophageal reflux disease)   . Goiter   . Hyperlipidemia   . Hypertension    Past Surgical History:  Procedure Laterality Date  . HAND SURGERY      OB History   No obstetric history on file.      Home Medications    Prior to Admission medications   Medication Sig Start Date End Date Taking? Authorizing Provider  amLODipine (NORVASC) 10 MG tablet Take 10 mg by mouth daily.   Yes [provider]  atorvastatin (LIPITOR) 20 MG tablet Take by mouth. 04/10/20  Yes [provider]  cetirizine (ZYRTEC) 10 MG tablet Take 10 mg by mouth daily.   Yes [provider]  EPINEPHrine 0.3 mg/0.3 mL IJ SOAJ injection Inject 0.3 mLs (0.3 mg total) into the muscle once. 10/17/15  Yes Darci Current, MD  hydrochlorothiazide (HYDRODIURIL) 25 MG tablet Take 25 mg by mouth daily.   Yes [provider]  HYDROcodone-acetaminophen (NORCO/VICODIN) 5-325 MG tablet Take 1 tablet by mouth every 6 (six) hours as needed. 03/18/18  Yes [provider]  ipratropium (ATROVENT) 0.06 % nasal spray Place 2 sprays into both nostrils 4 (four) times daily as needed for rhinitis. 01/25/21  Yes Klayton Monie G, DO  meloxicam (MOBIC) 15 MG tablet Take 15 mg by mouth daily. 11/20/20  Yes [provider]  montelukast  (SINGULAIR) 10 MG tablet Take 10 mg by mouth daily. 12/08/20  Yes [provider]  omeprazole (PRILOSEC) 10 MG capsule Take 10 mg by mouth daily.   Yes [provider]  promethazine-dextromethorphan (PROMETHAZINE-DM) 6.25-15 MG/5ML syrup Take 5 mLs by mouth 4 (four) times daily as needed for cough. 01/25/21  Yes Amber Williard G, DO  acetaminophen (TYLENOL) 325 MG tablet Take 650 mg by mouth every 6 (six) hours as needed.    [provider]    Family History Family History  Problem Relation Age of Onset  . Diabetes Father     Social History Social History   Tobacco Use  . Smoking status: Never Smoker  . Smokeless tobacco: Never Used  Vaping Use  . Vaping Use: Never used  Substance Use Topics  . Alcohol use: No  . Drug use: No     Allergies   Benicar [olmesartan], Ciprofloxacin, and Ceftin [cefuroxime axetil]   Review of Systems Review of Systems  Constitutional: Negative for fever.  HENT: Positive for congestion and voice change.   Respiratory: Positive for cough.     Physical Exam Triage Vital Signs ED Triage Vitals  Enc Vitals Group     BP 01/25/21 1459 (!) 151/73     Pulse Rate 01/25/21 1459 73     Resp 01/25/21 1459 18     Temp 01/25/21 1459 98.7 F (37.1 C)     Temp  Source 01/25/21 1459 Oral     SpO2 01/25/21 1459 98 %     Weight 01/25/21 1456 220 lb (99.8 kg)     Height 01/25/21 1456 5' 4.5" (1.638 m)     Head Circumference --      Peak Flow --      Pain Score 01/25/21 1456 0     Pain Loc --      Pain Edu? --      Excl. in GC? --    No data found.  Updated Vital Signs BP (!) 151/73 (BP Location: Left Arm)   Pulse 73   Temp 98.7 F (37.1 C) (Oral)   Resp 18   Ht 5' 4.5" (1.638 m)   Wt 99.8 kg   SpO2 98%   BMI 37.18 kg/m   Visual Acuity Right Eye Distance:   Left Eye Distance:   Bilateral Distance:    Right Eye Near:   Left Eye Near:    Bilateral Near:     Physical Exam Vitals and nursing note reviewed.   Constitutional:      General: She is not in acute distress.    Appearance: Normal appearance. She is not ill-appearing.  HENT:     Head: Normocephalic and atraumatic.     Right Ear: Tympanic membrane normal.     Left Ear: Tympanic membrane normal.     Nose: Congestion present.  Cardiovascular:     Rate and Rhythm: Normal rate and regular rhythm.     Heart sounds: No murmur heard.   Pulmonary:     Effort: Pulmonary effort is normal.     Breath sounds: Normal breath sounds. No wheezing, rhonchi or rales.  Neurological:     Mental Status: She is alert.  Psychiatric:        Mood and Affect: Mood normal.        Behavior: Behavior normal.    UC Treatments / Results  Labs (all labs ordered are listed, but only abnormal results are displayed) Labs Reviewed - No data to display  EKG   Radiology No results found.  Procedures Procedures (including critical care time)  Medications Ordered in UC Medications - No data to display  Initial Impression / Assessment and Plan / UC Course  I have reviewed the triage vital signs and the nursing notes.  Pertinent labs & imaging results that were available during my care of the patient were reviewed by me and considered in my medical decision making (see chart for details).    70 year old female presents with a viral URI with cough.  Treating with Promethazine DM and Atrovent nasal spray.  Final Clinical Impressions(s) / UC Diagnoses   Final diagnoses:  Viral URI with cough     Discharge Instructions     Rest.  Medications as directed for cough and congestion.  Take care  Dr. Adriana Simas    ED Prescriptions    Medication Sig Dispense Auth. Provider   promethazine-dextromethorphan (PROMETHAZINE-DM) 6.25-15 MG/5ML syrup Take 5 mLs by mouth 4 (four) times daily as needed for cough. 118 mL Coriann Brouhard G, DO   ipratropium (ATROVENT) 0.06 % nasal spray Place 2 sprays into both nostrils 4 (four) times daily as needed for rhinitis. 15  mL Tommie Sams, DO     PDMP not reviewed this encounter.   Tommie Sams, [place] 01/25/21 1626

## 2023-07-16 ENCOUNTER — Ambulatory Visit
Admission: EM | Admit: 2023-07-16 | Discharge: 2023-07-16 | Disposition: A | Discharge status: Home / Self Care | Payer: Medicare Other | Attending: Family Medicine | Admitting: Family Medicine

## 2023-07-16 DIAGNOSIS — H60392 Other infective otitis externa, left ear: Secondary | ICD-10-CM | POA: Diagnosis not present

## 2023-07-16 MED ORDER — NEOMYCIN-POLYMYXIN-HC 3.5-10000-1 OT SUSP
4.0000 [drp] | Freq: Three times a day (TID) | OTIC | 0 refills | Status: AC
Start: 2023-07-16 — End: 2023-07-23

## 2023-07-16 NOTE — ED Triage Notes (Signed)
Pt c/o left side ear pain x3days  Pt states that it has been aching and throbbing and she can not hear

## 2023-07-16 NOTE — ED Provider Notes (Signed)
[facility]    CSN: [id number] Arrival date & time: 07/16/23  1191      History   Chief Complaint Chief Complaint  Patient presents with   Ear Pain    HPI  72 year old female presents for evaluation of ear pain.  3-day history of left ear pain.  Has been aching and throbbing.  Difficulty hearing.  Pain 7/10 in severity.  No relieving factors.  No other significant respiratory symptoms.  No other complaints or concerns this time.  Past Medical History:  Diagnosis Date   Diverticulitis    GERD (gastroesophageal reflux disease)    Goiter    Hyperlipidemia    Hypertension     There are no problems to display for this patient.   Past Surgical History:  Procedure Laterality Date   HAND SURGERY      OB History   No obstetric history on file.      Home Medications    Prior to Admission medications   Medication Sig Start Date End Date Taking? Authorizing Provider  acetaminophen (TYLENOL) 325 MG tablet Take 650 mg by mouth every 6 (six) hours as needed.   Yes [provider]  amLODipine (NORVASC) 10 MG tablet Take 10 mg by mouth daily.   Yes [provider]  atorvastatin (LIPITOR) 20 MG tablet Take by mouth. 04/10/20  Yes [provider]  cetirizine (ZYRTEC) 10 MG tablet Take 10 mg by mouth daily.   Yes [provider]  EPINEPHrine 0.3 mg/0.3 mL IJ SOAJ injection Inject 0.3 mLs (0.3 mg total) into the muscle once. 10/17/15  Yes Darci Current, MD  hydrochlorothiazide (HYDRODIURIL) 25 MG tablet Take 25 mg by mouth daily.   Yes [provider]  HYDROcodone-acetaminophen (NORCO/VICODIN) 5-325 MG tablet Take 1 tablet by mouth every 6 (six) hours as needed. 03/18/18  Yes [provider]  meloxicam (MOBIC) 15 MG tablet Take 15 mg by mouth daily. 11/20/20  Yes [provider]  montelukast (SINGULAIR) 10 MG tablet Take 10 mg by mouth daily. 12/08/20  Yes [provider]   neomycin-polymyxin-hydrocortisone (CORTISPORIN) 3.5-10000-1 OTIC suspension Place 4 drops into the left ear 3 (three) times daily for 7 days. 07/16/23 07/23/23 Yes Tyreak Reagle G, DO  omeprazole (PRILOSEC) 10 MG capsule Take 10 mg by mouth daily.   Yes [provider]  ipratropium (ATROVENT) 0.06 % nasal spray Place 2 sprays into both nostrils 4 (four) times daily as needed for rhinitis. 01/25/21   Tommie Sams, DO  promethazine-dextromethorphan (PROMETHAZINE-DM) 6.25-15 MG/5ML syrup Take 5 mLs by mouth 4 (four) times daily as needed for cough. 01/25/21   Tommie Sams, DO    Family History Family History  Problem Relation Age of Onset   Diabetes Father     Social History Social History   Tobacco Use   Smoking status: Never   Smokeless tobacco: Never  Vaping Use   Vaping status: Never Used  Substance Use Topics   Alcohol use: No   Drug use: No     Allergies   Ciprofloxacin, Ciprofloxacin hcl, Olmesartan, and Ceftin [cefuroxime axetil]   Review of Systems Review of Systems Per HPI  Physical Exam Triage Vital Signs ED Triage Vitals  Encounter Vitals Group     BP 07/16/23 0847 132/83     Systolic BP Percentile --      Diastolic BP Percentile --      Pulse Rate 07/16/23 0847 77     Resp --  Temp 07/16/23 0847 99.2 F (37.3 C)     Temp Source 07/16/23 0847 Oral     SpO2 07/16/23 0847 94 %     Weight 07/16/23 0845 200 lb (90.7 kg)     Height 07/16/23 0845 5\' 4"  (1.626 m)     Head Circumference --      Peak Flow --      Pain Score 07/16/23 0845 7     Pain Loc --      Pain Education --      Exclude from Growth Chart --    No data found.  Updated Vital Signs BP 132/83 (BP Location: Left Arm)   Pulse 77   Temp 99.2 F (37.3 C) (Oral)   Ht 5\' 4"  (1.626 m)   Wt 90.7 kg   SpO2 94%   BMI 34.33 kg/m   Visual Acuity Right Eye Distance:   Left Eye Distance:   Bilateral Distance:    Right Eye Near:   Left Eye Near:    Bilateral Near:     Physical  Exam Constitutional:      General: She is not in acute distress.    Appearance: Normal appearance.  HENT:     Head: Normocephalic and atraumatic.     Ears:     Comments: Left TM initially obscured by cerumen and debris.  After lavage, debris still present in the canal with significant erythema consistent with otitis externa. Pulmonary:     Effort: Pulmonary effort is normal. No respiratory distress.  Neurological:     Mental Status: She is alert.      UC Treatments / Results  Labs (all labs ordered are listed, but only abnormal results are displayed) Labs Reviewed - No data to display  EKG   Radiology No results found.  Procedures Procedures (including critical care time)  Medications Ordered in UC Medications - No data to display  Initial Impression / Assessment and Plan / UC Course  I have reviewed the triage vital signs and the nursing notes.  Pertinent labs & imaging results that were available during my care of the patient were reviewed by me and considered in my medical decision making (see chart for details).    72 year old female presents with otitis externa.  Treating with Cortisporin otic.  Final Clinical Impressions(s) / UC Diagnoses   Final diagnoses:  Infective otitis externa of left ear   Discharge Instructions   None    ED Prescriptions     Medication Sig Dispense Auth. Provider   neomycin-polymyxin-hydrocortisone (CORTISPORIN) 3.5-10000-1 OTIC suspension Place 4 drops into the left ear 3 (three) times daily for 7 days. 10 mL Tommie Sams, DO      PDMP not reviewed this encounter.   Everlene Other [place], [place] 07/16/23 [phone]

## 2024-04-09 ENCOUNTER — Ambulatory Visit: Payer: Self-pay

## 2024-04-09 DIAGNOSIS — K5730 Diverticulosis of large intestine without perforation or abscess without bleeding: Secondary | ICD-10-CM | POA: Diagnosis not present

## 2024-04-09 DIAGNOSIS — Z1211 Encounter for screening for malignant neoplasm of colon: Secondary | ICD-10-CM | POA: Diagnosis present

## 2024-04-09 DIAGNOSIS — K449 Diaphragmatic hernia without obstruction or gangrene: Secondary | ICD-10-CM | POA: Diagnosis not present

## 2024-04-09 DIAGNOSIS — R1319 Other dysphagia: Secondary | ICD-10-CM | POA: Diagnosis not present

## 2024-04-09 DIAGNOSIS — K222 Esophageal obstruction: Secondary | ICD-10-CM | POA: Diagnosis not present

## 2024-04-09 DIAGNOSIS — D175 Benign lipomatous neoplasm of intra-abdominal organs: Secondary | ICD-10-CM | POA: Diagnosis not present

## 2024-04-09 DIAGNOSIS — Z8719 Personal history of other diseases of the digestive system: Secondary | ICD-10-CM | POA: Diagnosis not present

## 2024-04-09 DIAGNOSIS — K219 Gastro-esophageal reflux disease without esophagitis: Secondary | ICD-10-CM | POA: Diagnosis not present

## 2024-04-09 DIAGNOSIS — Z800 Family history of malignant neoplasm of digestive organs: Secondary | ICD-10-CM | POA: Diagnosis not present

## 2024-04-17 ENCOUNTER — Ambulatory Visit: Admission: EM | Admit: 2024-04-17 | Discharge: 2024-04-17 | Disposition: A | Discharge status: Home / Self Care

## 2024-04-17 DIAGNOSIS — H5789 Other specified disorders of eye and adnexa: Secondary | ICD-10-CM

## 2024-04-17 NOTE — ED Triage Notes (Signed)
 Patient states that she noticed that her right eye was having discharge last night and itching and red today. Feels like something is in the eye /

## 2024-04-17 NOTE — ED Provider Notes (Signed)
 [facility]    CSN: [id number] Arrival date & time: 04/17/24  1548      History   Chief Complaint Chief Complaint  Patient presents with   Eye Problem    HPI Erica Quinn is a 72 y.o. female presenting for right eye redness, itching and irritation with clear watery discharge since yesterday.  She denies pain, nasal congestion, cough.  No facial swelling or vision changes.  Has been using refresh rewetting drops.  Wears glasses and does not wear contacts.  Denies injury to eye.  Reports it feels like something is in the eye.  HPI  Past Medical History:  Diagnosis Date   Diverticulitis    GERD (gastroesophageal reflux disease)    Goiter    Hyperlipidemia    Hypertension     There are no active problems to display for this patient.   Past Surgical History:  Procedure Laterality Date   HAND SURGERY      OB History   No obstetric history on file.      Home Medications    Prior to Admission medications   Medication Sig Start Date End Date Taking? Authorizing Provider  amLODipine (NORVASC) 10 MG tablet Take 10 mg by mouth daily.   Yes [provider]  atorvastatin (LIPITOR) 20 MG tablet Take by mouth. 04/10/20  Yes [provider]  hydrochlorothiazide (HYDRODIURIL) 25 MG tablet Take 25 mg by mouth daily.   Yes [provider]  omeprazole (PRILOSEC) 10 MG capsule Take 10 mg by mouth daily.   Yes [provider]  acetaminophen (TYLENOL) 325 MG tablet Take 650 mg by mouth every 6 (six) hours as needed.    [provider]  cetirizine  (ZYRTEC ) 10 MG tablet Take 10 mg by mouth daily.    [provider]  EPINEPHrine  0.3 mg/0.3 mL IJ SOAJ injection Inject 0.3 mLs (0.3 mg total) into the muscle once. 10/17/15   Dannial Duty, MD  HYDROcodone-acetaminophen (NORCO/VICODIN) 5-325 MG tablet Take 1 tablet by mouth every 6 (six) hours as needed. 03/18/18   [provider]  ipratropium (ATROVENT ) 0.06 % nasal  spray Place 2 sprays into both nostrils 4 (four) times daily as needed for rhinitis. 01/25/21   Cook, Jayce G, DO  meloxicam (MOBIC) 15 MG tablet Take 15 mg by mouth daily. 11/20/20   [provider]  montelukast (SINGULAIR) 10 MG tablet Take 10 mg by mouth daily. 12/08/20   [provider]  promethazine -dextromethorphan (PROMETHAZINE -DM) 6.25-15 MG/5ML syrup Take 5 mLs by mouth 4 (four) times daily as needed for cough. 01/25/21   Cook, Jayce G, DO    Family History Family History  Problem Relation Age of Onset   Diabetes Father     Social History Social History   Tobacco Use   Smoking status: Never   Smokeless tobacco: Never  Vaping Use   Vaping status: Never Used  Substance Use Topics   Alcohol use: No   Drug use: No     Allergies   Ciprofloxacin, Ciprofloxacin hcl, Olmesartan, and Ceftin [cefuroxime axetil]   Review of Systems Review of Systems  Constitutional:  Negative for fatigue and fever.  HENT:  Negative for congestion, facial swelling and rhinorrhea.   Eyes:  Positive for discharge and redness. Negative for photophobia, pain and visual disturbance.  Respiratory:  Negative for cough.   Neurological:  Negative for dizziness and headaches.     Physical Exam Triage Vital Signs ED Triage Vitals  Encounter Vitals Group  BP 04/17/24 1636 (!) 152/74     Girls Systolic BP Percentile --      Girls Diastolic BP Percentile --      Boys Systolic BP Percentile --      Boys Diastolic BP Percentile --      Pulse Rate 04/17/24 1636 78     Resp 04/17/24 1636 18     Temp 04/17/24 1636 98 F (36.7 C)     Temp Source 04/17/24 1636 Oral     SpO2 04/17/24 1636 97 %     Weight --      Height --      Head Circumference --      Peak Flow --      Pain Score 04/17/24 1635 0     Pain Loc --      Pain Education --      Exclude from Growth Chart --    No data found.  Updated Vital Signs BP (!) 152/74 (BP Location: Right Arm)   Pulse 78   Temp 98 F  (36.7 C) (Oral)   Resp 18   SpO2 97%   Visual Acuity   Right Eye Near: R Near: 20/30 Left Eye Near:  L Near: 20/30 Bilateral Near:  20/25 (with correction)  Physical Exam Vitals and nursing note reviewed.  Constitutional:      General: She is not in acute distress.    Appearance: Normal appearance. She is not ill-appearing or toxic-appearing.  HENT:     Head: Normocephalic and atraumatic.     Nose: Nose normal.     Mouth/Throat:     Mouth: Mucous membranes are moist.     Pharynx: Oropharynx is clear.   Eyes:     General: Lids are normal. Lids are everted, no foreign bodies appreciated. Vision grossly intact. No scleral icterus.       Right eye: No discharge.        Left eye: No discharge.     Conjunctiva/sclera:     Right eye: Right conjunctiva is injected (mild).    Cardiovascular:     Rate and Rhythm: Normal rate and regular rhythm.  Pulmonary:     Effort: Pulmonary effort is normal. No respiratory distress.   Musculoskeletal:     Cervical back: Neck supple.   Skin:    General: Skin is dry.   Neurological:     General: No focal deficit present.     Mental Status: She is alert. Mental status is at baseline.     Motor: No weakness.     Gait: Gait normal.   Psychiatric:        Mood and Affect: Mood normal.        Behavior: Behavior normal.      UC Treatments / Results  Labs (all labs ordered are listed, but only abnormal results are displayed) Labs Reviewed - No data to display  EKG   Radiology No results found.  Procedures Procedures (including critical care time)  Medications Ordered in UC Medications - No data to display  Initial Impression / Assessment and Plan / UC Course  I have reviewed the triage vital signs and the nursing notes.  Pertinent labs & imaging results that were available during my care of the patient were reviewed by me and considered in my medical decision making (see chart for details).   73 year old female presents  for right eye redness, irritation and clear watery drainage since yesterday.  Denies injury.  Wears glasses.  Vision  grossly intact.  Right eye irritation.  Not consistent with bacterial conjunctivitis or foreign body.  No evidence of corneal abrasion.  Reviewed over-the-counter Naphcon-A or Pataday, cool compresses and rest.  Reviewed returning for discolored drainage, eye pain, vision changes, etc.   Final Clinical Impressions(s) / UC Diagnoses   Final diagnoses:  Irritation of right eye     Discharge Instructions      -Low suspicion for pink eye -Cool compresses and try over the counter Pataday or Naphcon-A  -Return if thick yellow/green drainage, eye pain, vision changes, facial swelling     ED Prescriptions   None    PDMP not reviewed this encounter.   Floydene Hy, PA-C 04/17/24 1656

## 2024-04-17 NOTE — Discharge Instructions (Signed)
-  Low suspicion for pink eye -Cool compresses and try over the counter Pataday or Naphcon-A  -Return if thick yellow/green drainage, eye pain, vision changes, facial swelling

## 2024-06-14 ENCOUNTER — Encounter: Payer: Self-pay | Admitting: Otolaryngology

## 2024-06-14 ENCOUNTER — Other Ambulatory Visit: Payer: Self-pay | Admitting: Otolaryngology

## 2024-06-14 DIAGNOSIS — H903 Sensorineural hearing loss, bilateral: Secondary | ICD-10-CM

## 2024-06-15 ENCOUNTER — Encounter: Payer: Self-pay | Admitting: Otolaryngology

## 2024-06-19 ENCOUNTER — Ambulatory Visit
Admission: RE | Admit: 2024-06-19 | Discharge: 2024-06-19 | Disposition: A | Discharge status: Home / Self Care | Source: Ambulatory Visit | Attending: Otolaryngology | Admitting: Otolaryngology

## 2024-06-19 DIAGNOSIS — H903 Sensorineural hearing loss, bilateral: Secondary | ICD-10-CM

## 2024-06-19 MED ORDER — GADOPICLENOL 0.5 MMOL/ML IV SOLN
10.0000 mL | Freq: Once | INTRAVENOUS | Status: AC | PRN
  Administered 2024-06-19: 10 mL via INTRAVENOUS

## 2024-06-30 ENCOUNTER — Encounter: Payer: Self-pay | Admitting: Emergency Medicine

## 2024-06-30 ENCOUNTER — Ambulatory Visit: Admission: EM | Admit: 2024-06-30 | Discharge: 2024-06-30 | Disposition: A | Discharge status: Home / Self Care

## 2024-06-30 DIAGNOSIS — H60313 Diffuse otitis externa, bilateral: Secondary | ICD-10-CM | POA: Diagnosis not present

## 2024-06-30 DIAGNOSIS — H6123 Impacted cerumen, bilateral: Secondary | ICD-10-CM | POA: Diagnosis not present

## 2024-06-30 MED ORDER — NEOMYCIN-POLYMYXIN-HC 3.5-10000-1 OT SUSP: 4.0000 [drp] | Freq: Three times a day (TID) | OTIC | 0 refills | Status: AC

## 2024-06-30 NOTE — ED Provider Notes (Signed)
 [facility]  Note:  This document was prepared using Conservation officer, historic buildings and may include unintentional dictation errors.  MRN: [id number] DOB: Sep 01, 1951  Subjective:   Erica Quinn is a 73 y.o. female presenting for ongoing right ear pain and decreased hearing x 1 week.  Patient reports that she recently had an ear infection in her left ear approximately 2 months ago.  Patient reports she is recently seen by ear doctor who evaluated her ears and stated there was no issue and told her to follow-up if there is any other problems.  Patient not been taking any over-the-counter medication to treat symptoms.  Denies any fever, nasal congestion, cough, sore throat, body aches.  Patient would like to evaluation to make sure that she does not have a repeat ear infection.  No current facility-administered medications for this encounter.  Current Outpatient Medications:    neomycin -polymyxin-hydrocortisone (CORTISPORIN) 3.5-10000-1 OTIC suspension, Place 4 drops into both ears 3 (three) times daily., Disp: 10 mL, Rfl: 0   acetaminophen (TYLENOL) 325 MG tablet, Take 650 mg by mouth every 6 (six) hours as needed., Disp: , Rfl:    amLODipine (NORVASC) 10 MG tablet, Take 10 mg by mouth daily., Disp: , Rfl:    atorvastatin (LIPITOR) 20 MG tablet, Take by mouth., Disp: , Rfl:    cetirizine  (ZYRTEC ) 10 MG tablet, Take 10 mg by mouth daily., Disp: , Rfl:    EPINEPHrine  0.3 mg/0.3 mL IJ SOAJ injection, Inject 0.3 mLs (0.3 mg total) into the muscle once., Disp: 1 Device, Rfl: 0   hydrochlorothiazide (HYDRODIURIL) 25 MG tablet, Take 25 mg by mouth daily., Disp: , Rfl:    HYDROcodone-acetaminophen (NORCO/VICODIN) 5-325 MG tablet, Take 1 tablet by mouth every 6 (six) hours as needed., Disp: , Rfl:    ipratropium (ATROVENT ) 0.06 % nasal spray, Place 2 sprays into both nostrils 4 (four) times daily as needed for rhinitis., Disp: 15 mL, Rfl: 0   meloxicam (MOBIC) 15 MG tablet, Take 15 mg by mouth  daily., Disp: , Rfl:    montelukast (SINGULAIR) 10 MG tablet, Take 10 mg by mouth daily., Disp: , Rfl:    omeprazole (PRILOSEC) 10 MG capsule, Take 10 mg by mouth daily., Disp: , Rfl:    promethazine -dextromethorphan (PROMETHAZINE -DM) 6.25-15 MG/5ML syrup, Take 5 mLs by mouth 4 (four) times daily as needed for cough., Disp: 118 mL, Rfl: 0   Allergies  Allergen Reactions   Ciprofloxacin Hives   Ciprofloxacin Hcl Hives   Olmesartan Hives   Ceftin [Cefuroxime Axetil] Rash    Past Medical History:  Diagnosis Date   Diverticulitis    GERD (gastroesophageal reflux disease)    Goiter    Hyperlipidemia    Hypertension      Past Surgical History:  Procedure Laterality Date   HAND SURGERY      Family History  Problem Relation Age of Onset   Diabetes Father     Social History   Tobacco Use   Smoking status: Never   Smokeless tobacco: Never  Vaping Use   Vaping status: Never Used  Substance Use Topics   Alcohol use: No   Drug use: No    ROS Refer to HPI for ROS details.  Objective:   Vitals: BP (!) 139/56 (BP Location: Right Arm)   Pulse 74   Temp 99 F (37.2 C) (Oral)   Resp 16   Ht 5' 4 (1.626 m)   Wt 199 lb 15.3 oz (90.7 kg)   SpO2 99%  BMI 34.32 kg/m   Physical Exam Vitals and nursing note reviewed.  Constitutional:      General: She is not in acute distress.    Appearance: Normal appearance. She is well-developed. She is not ill-appearing or toxic-appearing.  HENT:     Head: Normocephalic and atraumatic.     Right Ear: External ear normal. There is impacted cerumen.     Left Ear: External ear normal. There is impacted cerumen.     Nose: Nose normal. No congestion or rhinorrhea.     Mouth/Throat:     Mouth: Mucous membranes are moist.     Pharynx: No posterior oropharyngeal erythema.  Cardiovascular:     Rate and Rhythm: Normal rate.  Pulmonary:     Effort: Pulmonary effort is normal. No respiratory distress.  Skin:    General: Skin is warm and  dry.  Neurological:     General: No focal deficit present.     Mental Status: She is alert and oriented to person, place, and time.  Psychiatric:        Mood and Affect: Mood normal.        Behavior: Behavior normal.   Reevaluation of ear canals post irrigation reveals purulent discharge in ear canal and mild erythema, possibly consistent with otitis externa.  Empiric treatment with Cortisporin otic suspension initiated by provider.  Procedures  No results found for this or any previous visit (from the past 24 hours).  Assessment and Plan :     Discharge Instructions       1. Bilateral impacted cerumen (Primary) - Ear wax removal completed in UC, minimal cerumen retained to bilateral ear canals following irrigation.  No significant erythema or swelling to tympanic membrane.  Erythema and purulent discharge noted in bilateral ear canals.  2. Acute diffuse otitis externa of both ears - neomycin -polymyxin-hydrocortisone (CORTISPORIN) 3.5-10000-1 OTIC suspension; Place 4 drops into both ears 3 (three) times daily.  Dispense: 10 mL; Refill: 0  -Continue to monitor symptoms for any change in severity if there is any escalation of current symptoms or development of new symptoms follow-up in ER for further evaluation and management.      Jamichael Knotts B Raylie Maddison   Sidi Dzikowski, [place], [place] 06/30/24 1410

## 2024-06-30 NOTE — Discharge Instructions (Addendum)
  1. Bilateral impacted cerumen (Primary) - Ear wax removal completed in UC, minimal cerumen retained to bilateral ear canals following irrigation.  No significant erythema or swelling to tympanic membrane.  Erythema and purulent discharge noted in bilateral ear canals.  2. Acute diffuse otitis externa of both ears - neomycin -polymyxin-hydrocortisone (CORTISPORIN) 3.5-10000-1 OTIC suspension; Place 4 drops into both ears 3 (three) times daily.  Dispense: 10 mL; Refill: 0  -Continue to monitor symptoms for any change in severity if there is any escalation of current symptoms or development of new symptoms follow-up in ER for further evaluation and management.

## 2024-06-30 NOTE — ED Triage Notes (Signed)
 Patient c/o ringing and pain in her right ear for a week.  Patient unsure of fevers.  Patient had ear infection in left ear couple months ago.

## 2024-08-16 ENCOUNTER — Ambulatory Visit: Admission: EM | Admit: 2024-08-16 | Discharge: 2024-08-16 | Disposition: A | Discharge status: Home / Self Care

## 2024-08-16 DIAGNOSIS — H9202 Otalgia, left ear: Secondary | ICD-10-CM

## 2024-08-16 DIAGNOSIS — H66004 Acute suppurative otitis media without spontaneous rupture of ear drum, recurrent, right ear: Secondary | ICD-10-CM

## 2024-08-16 HISTORY — DX: Endometriosis, unspecified: N80.9

## 2024-08-16 MED ORDER — AMOXICILLIN 875 MG PO TABS: 875.0000 mg | ORAL_TABLET | Freq: Two times a day (BID) | ORAL | 0 refills | Status: AC

## 2024-08-16 NOTE — Discharge Instructions (Addendum)
 Take antibiotic as directed Follow up with ENT-call for appt Do not stick anything in your ears Do not get water in ears

## 2024-08-16 NOTE — ED Triage Notes (Signed)
 Pt being seen in UC for L ear pain. Pt reports pain began approximately 2 weeks ago, pt has been seen in past for same concern. Pt reports having fever yesterday of 101F, took tylenol. Pt reports some nasal congestion.

## 2024-08-16 NOTE — ED Provider Notes (Signed)
 [facility]    CSN: [id number] Arrival date & time: 08/16/24  1245      History   Chief Complaint Chief Complaint  Patient presents with   Otalgia    HPI Erica Quinn is a 73 y.o. female.   73 year old female, Erica Quinn, presents to urgent care for evaluation of left ear pain.  Patient reports about 2 weeks ago she started having ear pain and then had a fever yesterday of 101 also complaining of nasal congestion.  Patient states she has seen ENT in the past for the same but was unable to get an appointment with them or her PCP.  Only thing that works for me is amoxicillin , no antibiotics in last 30 days per patient report.  The history is provided by the patient. No language interpreter was used.    Past Medical History:  Diagnosis Date   Diverticulitis    Endometriosis    cancer   GERD (gastroesophageal reflux disease)    Goiter    Hyperlipidemia    Hypertension     Patient Active Problem List   Diagnosis Date Noted   Otalgia of left ear 08/16/2024   Recurrent acute suppurative otitis media of right ear without spontaneous rupture of tympanic membrane 08/16/2024    Past Surgical History:  Procedure Laterality Date   HAND SURGERY     LOBECTOMY     2023-from endometriosis spreading    OB History   No obstetric history on file.      Home Medications    Prior to Admission medications   Medication Sig Start Date End Date Taking? Authorizing Provider  amoxicillin  (AMOXIL ) 875 MG tablet Take 1 tablet (875 mg total) by mouth 2 (two) times daily for 7 days. 08/16/24 08/23/24 Yes Fayette Hamada, NP  acetaminophen (TYLENOL) 325 MG tablet Take 650 mg by mouth every 6 (six) hours as needed.    [provider]  amLODipine (NORVASC) 10 MG tablet Take 10 mg by mouth daily.    [provider]  aspirin EC 81 MG tablet Take 81 mg by mouth daily.    [provider]  atorvastatin (LIPITOR) 20 MG tablet Take by mouth. 04/10/20    [provider]  cetirizine  (ZYRTEC ) 10 MG tablet Take 10 mg by mouth daily.    [provider]  EPINEPHrine  0.3 mg/0.3 mL IJ SOAJ injection Inject 0.3 mLs (0.3 mg total) into the muscle once. 10/17/15   Delores Raford SAILOR, MD  fluticasone (FLONASE) 50 MCG/ACT nasal spray Place 2 sprays into both nostrils daily.    [provider]  hydrochlorothiazide (HYDRODIURIL) 25 MG tablet Take 25 mg by mouth daily.    [provider]  HYDROcodone-acetaminophen (NORCO/VICODIN) 5-325 MG tablet Take 1 tablet by mouth every 6 (six) hours as needed. Patient not taking: Reported on 08/16/2024 03/18/18   [provider]  ipratropium (ATROVENT ) 0.06 % nasal spray Place 2 sprays into both nostrils 4 (four) times daily as needed for rhinitis. Patient not taking: Reported on 08/16/2024 01/25/21   Cook, Jayce G, DO  letrozole (FEMARA) 2.5 MG tablet Take 2.5 mg by mouth daily.    [provider]  lisinopril (ZESTRIL) 10 MG tablet Take 10 mg by mouth daily.    [provider]  meloxicam (MOBIC) 15 MG tablet Take 15 mg by mouth daily. Patient not taking: Reported on 08/16/2024 11/20/20   [provider]  montelukast (SINGULAIR) 10 MG tablet Take 10 mg by mouth daily.  Patient not taking: Reported on 08/16/2024 12/08/20   [provider]  neomycin -polymyxin-hydrocortisone (CORTISPORIN) 3.5-10000-1 OTIC suspension Place 4 drops into both ears 3 (three) times daily. 06/30/24   Reddick, Johnathan B, NP  omeprazole (PRILOSEC) 10 MG capsule Take 10 mg by mouth daily.    [provider]  promethazine -dextromethorphan (PROMETHAZINE -DM) 6.25-15 MG/5ML syrup Take 5 mLs by mouth 4 (four) times daily as needed for cough. Patient not taking: Reported on 08/16/2024 01/25/21   Cook, Jayce G, DO  Semaglutide, 1 MG/DOSE, (OZEMPIC, 1 MG/DOSE,) 4 MG/3ML SOPN Inject 2.5 mg into the skin once a week.    [provider]    Family History Family History   Problem Relation Age of Onset   Diabetes Father     Social History Social History   Tobacco Use   Smoking status: Never   Smokeless tobacco: Never  Vaping Use   Vaping status: Never Used  Substance Use Topics   Alcohol use: No   Drug use: No     Allergies   Ciprofloxacin, Ciprofloxacin hcl, Olmesartan, and Ceftin [cefuroxime axetil]   Review of Systems Review of Systems  Constitutional:  Positive for fever.  HENT:  Positive for congestion and ear pain. Negative for ear discharge.   All other systems reviewed and are negative.    Physical Exam Triage Vital Signs ED Triage Vitals  Encounter Vitals Group     BP 08/16/24 1312 132/62     Girls Systolic BP Percentile --      Girls Diastolic BP Percentile --      Boys Systolic BP Percentile --      Boys Diastolic BP Percentile --      Pulse Rate 08/16/24 1312 71     Resp 08/16/24 1312 19     Temp 08/16/24 1312 98.1 F (36.7 C)     Temp Source 08/16/24 1312 Oral     SpO2 08/16/24 1312 100 %     Weight --      Height --      Head Circumference --      Peak Flow --      Pain Score 08/16/24 1305 6     Pain Loc --      Pain Education --      Exclude from Growth Chart --    No data found.  Updated Vital Signs BP 132/62 (BP Location: Right Arm)   Pulse 71   Temp 98.1 F (36.7 C) (Oral)   Resp 19   SpO2 100%   Visual Acuity Right Eye Distance:   Left Eye Distance:   Bilateral Distance:    Right Eye Near:   Left Eye Near:    Bilateral Near:     Physical Exam Vitals and nursing note reviewed.  HENT:     Head: Normocephalic.     Comments: No mastoid erythema or tenderness bilaterally    Right Ear: Tympanic membrane normal.     Left Ear: Swelling and tenderness present. Tympanic membrane is erythematous.     Nose: Congestion present.     Mouth/Throat:     Lips: Pink.     Mouth: Mucous membranes are moist.     Pharynx: Oropharynx is clear. Uvula midline.  Cardiovascular:     Rate and Rhythm: Normal  rate.  Pulmonary:     Effort: Pulmonary effort is normal.  Neurological:     General: No focal deficit present.     Mental Status: She is alert and oriented to  person, place, and time.     GCS: GCS eye subscore is 4. GCS verbal subscore is 5. GCS motor subscore is 6.  Psychiatric:        Attention and Perception: Attention normal.        Mood and Affect: Mood normal.        Speech: Speech normal.        Behavior: Behavior normal. Behavior is cooperative.      UC Treatments / Results  Labs (all labs ordered are listed, but only abnormal results are displayed) Labs Reviewed - No data to display  EKG   Radiology No results found.  Procedures Procedures (including critical care time)  Medications Ordered in UC Medications - No data to display  Initial Impression / Assessment and Plan / UC Course  I have reviewed the triage vital signs and the nursing notes.  Pertinent labs & imaging results that were available during my care of the patient were reviewed by me and considered in my medical decision making (see chart for details).  Clinical Course as of 08/16/24 2102  Thu Aug 16, 2024  1334 Only thing that works for me is amoxicillin  denies allergy [JD]    Clinical Course User Index [JD] Kraven Calk, Rilla, NP  Discussed exam findings and plan of care with patient, amoxicillin  scripted , strict go to ER precautions given.   Patient verbalized understanding to this provider.  Ddx: Recurrent AOM, left otalgia, allergies, viral illness Final Clinical Impressions(s) / UC Diagnoses   Final diagnoses:  Otalgia of left ear  Recurrent acute suppurative otitis media of right ear without spontaneous rupture of tympanic membrane     Discharge Instructions      Take antibiotic as directed Follow up with ENT-call for appt Do not stick anything in your ears Do not get water in ears     ED Prescriptions     Medication Sig Dispense Auth. Provider   amoxicillin  (AMOXIL )  875 MG tablet Take 1 tablet (875 mg total) by mouth 2 (two) times daily for 7 days. 14 tablet Jye Fariss, Rilla, NP      PDMP not reviewed this encounter.   Aminta Rilla, NP 08/16/24 2102

## 2024-10-23 ENCOUNTER — Ambulatory Visit
Admission: EM | Admit: 2024-10-23 | Discharge: 2024-10-23 | Disposition: A | Discharge status: Home / Self Care | Attending: Emergency Medicine | Admitting: Emergency Medicine

## 2024-10-23 DIAGNOSIS — H6122 Impacted cerumen, left ear: Secondary | ICD-10-CM

## 2024-10-23 NOTE — Discharge Instructions (Addendum)
 Please return for new symptoms.

## 2024-10-23 NOTE — ED Provider Notes (Signed)
 " [facility]    CSN: [id number] Arrival date & time: 10/23/24  1318      History   Chief Complaint Chief Complaint  Patient presents with   Ear Fullness    HPI Erica Quinn is a 73 y.o. female.   HPI  73 year old female with past medical history sniffing for hypertension, hyperlipidemia, goiter, GERD, endometrial cancer, and diverticulosis presents for evaluation of 1 week worth of left ear fullness and pain.  Also decreased hearing in left ear.  She denies any fever or drainage.  She believes that she has a buildup of wax.  She was able to remove some of the wax at home by herself.  Past Medical History:  Diagnosis Date   Diverticulitis    Endometriosis    cancer   GERD (gastroesophageal reflux disease)    Goiter    Hyperlipidemia    Hypertension     Patient Active Problem List   Diagnosis Date Noted   Otalgia of left ear 08/16/2024   Recurrent acute suppurative otitis media of right ear without spontaneous rupture of tympanic membrane 08/16/2024    Past Surgical History:  Procedure Laterality Date   HAND SURGERY     LOBECTOMY     2023-from endometriosis spreading    OB History   No obstetric history on file.      Home Medications    Prior to Admission medications  Medication Sig Start Date End Date Taking? Authorizing Provider  acetaminophen (TYLENOL) 325 MG tablet Take 650 mg by mouth every 6 (six) hours as needed.    [provider]  amLODipine (NORVASC) 10 MG tablet Take 10 mg by mouth daily.    [provider]  aspirin EC 81 MG tablet Take 81 mg by mouth daily.    [provider]  atorvastatin (LIPITOR) 20 MG tablet Take by mouth. 04/10/20   [provider]  cetirizine  (ZYRTEC ) 10 MG tablet Take 10 mg by mouth daily.    [provider]  EPINEPHrine  0.3 mg/0.3 mL IJ SOAJ injection Inject 0.3 mLs (0.3 mg total) into the muscle once. 10/17/15   Delores Raford SAILOR, MD  fluticasone (FLONASE) 50  MCG/ACT nasal spray Place 2 sprays into both nostrils daily.    [provider]  hydrochlorothiazide (HYDRODIURIL) 25 MG tablet Take 25 mg by mouth daily.    [provider]  HYDROcodone-acetaminophen (NORCO/VICODIN) 5-325 MG tablet Take 1 tablet by mouth every 6 (six) hours as needed. Patient not taking: Reported on 08/16/2024 03/18/18   [provider]  ipratropium (ATROVENT ) 0.06 % nasal spray Place 2 sprays into both nostrils 4 (four) times daily as needed for rhinitis. Patient not taking: Reported on 08/16/2024 01/25/21   Cook, Jayce G, DO  letrozole (FEMARA) 2.5 MG tablet Take 2.5 mg by mouth daily.    [provider]  lisinopril (ZESTRIL) 10 MG tablet Take 10 mg by mouth daily.    [provider]  meloxicam (MOBIC) 15 MG tablet Take 15 mg by mouth daily. Patient not taking: Reported on 08/16/2024 11/20/20   [provider]  montelukast (SINGULAIR) 10 MG tablet Take 10 mg by mouth daily. Patient not taking: Reported on 08/16/2024 12/08/20   [provider]  neomycin -polymyxin-hydrocortisone (CORTISPORIN) 3.5-10000-1 OTIC suspension Place 4 drops into both ears 3 (three) times daily. 06/30/24   Reddick, Johnathan B, NP  omeprazole (PRILOSEC) 10 MG capsule Take 10 mg by mouth daily.    [provider]  promethazine -dextromethorphan (PROMETHAZINE -DM)  6.25-15 MG/5ML syrup Take 5 mLs by mouth 4 (four) times daily as needed for cough. Patient not taking: Reported on 08/16/2024 01/25/21   Cook, Jayce G, DO  Semaglutide, 1 MG/DOSE, (OZEMPIC, 1 MG/DOSE,) 4 MG/3ML SOPN Inject 2.5 mg into the skin once a week.    [provider]    Family History Family History  Problem Relation Age of Onset   Diabetes Father     Social History Social History[1]   Allergies   Ciprofloxacin, Ciprofloxacin hcl, Olmesartan, and Ceftin [cefuroxime axetil]   Review of Systems Review of Systems  Constitutional:  Negative for fever.   HENT:  Positive for ear pain and hearing loss. Negative for ear discharge.      Physical Exam Triage Vital Signs ED Triage Vitals [10/23/24 1350]  Encounter Vitals Group     BP 130/81     Girls Systolic BP Percentile      Girls Diastolic BP Percentile      Boys Systolic BP Percentile      Boys Diastolic BP Percentile      Pulse Rate 79     Resp 18     Temp 99.7 F (37.6 C)     Temp Source Oral     SpO2 95 %     Weight      Height      Head Circumference      Peak Flow      Pain Score 5     Pain Loc      Pain Education      Exclude from Growth Chart    No data found.  Updated Vital Signs BP 130/81 (BP Location: Right Arm)   Pulse 79   Temp 99.7 F (37.6 C) (Oral)   Resp 18   SpO2 95%   Visual Acuity Right Eye Distance:   Left Eye Distance:   Bilateral Distance:    Right Eye Near:   Left Eye Near:    Bilateral Near:     Physical Exam Vitals and nursing note reviewed.  Constitutional:      Appearance: Normal appearance. She is not ill-appearing.  HENT:     Head: Normocephalic and atraumatic.     Right Ear: Tympanic membrane, ear canal and external ear normal. There is no impacted cerumen.     Left Ear: External ear normal. There is impacted cerumen.     Ears:     Comments: Left EAC is full with yellow wax. Neurological:     Mental Status: She is alert.      UC Treatments / Results  Labs (all labs ordered are listed, but only abnormal results are displayed) Labs Reviewed - No data to display  EKG   Radiology No results found.  Procedures Procedures (including critical care time)  Medications Ordered in UC Medications - No data to display  Initial Impression / Assessment and Plan / UC Course  I have reviewed the triage vital signs and the nursing notes.  Pertinent labs & imaging results that were available during my care of the patient were reviewed by me and considered in my medical decision making (see chart for details).   Patient  is a pleasant, nontoxic-appearing 70 old female presenting for evaluation of 1 week worth of left ear pain and decreased hearing as outlined in HPI above.  On exam the left external auditory canal is occluded by yellow cerumen.  The right EAC is clear and the tympanic membrane is intact.  I will  order lavage of the left ear and reassess.  Ear lavage was successful removing the earwax from the external auditory canal.  The tympanic membrane is intact and pearly gray in appearance.  I will discharge patient home with a diagnosis of cerumen impaction.   Final Clinical Impressions(s) / UC Diagnoses   Final diagnoses:  Impacted cerumen of left ear     Discharge Instructions      Please return for new symptoms.     ED Prescriptions   None    PDMP not reviewed this encounter.     [1]  Social History Tobacco Use   Smoking status: Never   Smokeless tobacco: Never  Vaping Use   Vaping status: Never Used  Substance Use Topics   Alcohol use: No   Drug use: No     Bernardino Ditch, NP 10/23/24 1426  "

## 2024-10-23 NOTE — ED Triage Notes (Signed)
 Patient presents to UC for left ear fullness and pain x 1 week. Concerned with wax buildup. Treating pain with advil.
